# Patient Record
Sex: Female | Born: 1955 | Race: Black or African American | Hispanic: No | Smoking: Former smoker
Health system: Southern US, Community
[De-identification: ages and names within clinical notes are randomized; demographics above are authoritative.]

## PROBLEM LIST (undated history)

## (undated) ENCOUNTER — Emergency Department (HOSPITAL_COMMUNITY): Admission: EM | Source: Home / Self Care

## (undated) DIAGNOSIS — G43909 Migraine, unspecified, not intractable, without status migrainosus: Secondary | ICD-10-CM

## (undated) DIAGNOSIS — M1990 Unspecified osteoarthritis, unspecified site: Secondary | ICD-10-CM

## (undated) DIAGNOSIS — J069 Acute upper respiratory infection, unspecified: Secondary | ICD-10-CM

## (undated) DIAGNOSIS — E079 Disorder of thyroid, unspecified: Secondary | ICD-10-CM

## (undated) DIAGNOSIS — K529 Noninfective gastroenteritis and colitis, unspecified: Secondary | ICD-10-CM

## (undated) DIAGNOSIS — R112 Nausea with vomiting, unspecified: Secondary | ICD-10-CM

## (undated) DIAGNOSIS — E119 Type 2 diabetes mellitus without complications: Secondary | ICD-10-CM

## (undated) DIAGNOSIS — D649 Anemia, unspecified: Secondary | ICD-10-CM

## (undated) DIAGNOSIS — J189 Pneumonia, unspecified organism: Secondary | ICD-10-CM

## (undated) DIAGNOSIS — T8859XA Other complications of anesthesia, initial encounter: Secondary | ICD-10-CM

## (undated) DIAGNOSIS — Z9889 Other specified postprocedural states: Secondary | ICD-10-CM

## (undated) DIAGNOSIS — Z8719 Personal history of other diseases of the digestive system: Secondary | ICD-10-CM

## (undated) DIAGNOSIS — T4145XA Adverse effect of unspecified anesthetic, initial encounter: Secondary | ICD-10-CM

## (undated) HISTORY — DX: Acute upper respiratory infection, unspecified: J06.9

## (undated) HISTORY — PX: TUBAL LIGATION: SHX77

## (undated) HISTORY — PX: SINOSCOPY: SHX187

## (undated) HISTORY — PX: CATARACT EXTRACTION: SUR2

## (undated) HISTORY — PX: COLONOSCOPY: SHX174

## (undated) HISTORY — PX: NASAL POLYP SURGERY: SHX186

## (undated) HISTORY — DX: Type 2 diabetes mellitus without complications: E11.9

---

## 1999-04-01 ENCOUNTER — Emergency Department (HOSPITAL_COMMUNITY): Admission: EM | Admit: 1999-04-01 | Discharge: 1999-04-01 | Payer: Self-pay | Admitting: Emergency Medicine

## 1999-04-01 ENCOUNTER — Encounter: Payer: Self-pay | Admitting: Emergency Medicine

## 2004-11-28 ENCOUNTER — Other Ambulatory Visit: Admission: RE | Admit: 2004-11-28 | Discharge: 2004-11-28 | Payer: Self-pay | Admitting: Obstetrics & Gynecology

## 2004-12-11 ENCOUNTER — Encounter: Admission: RE | Admit: 2004-12-11 | Discharge: 2004-12-11 | Payer: Self-pay | Admitting: Internal Medicine

## 2005-03-16 ENCOUNTER — Emergency Department (HOSPITAL_COMMUNITY): Admission: EM | Admit: 2005-03-16 | Discharge: 2005-03-16 | Payer: Self-pay | Admitting: Emergency Medicine

## 2010-07-23 ENCOUNTER — Encounter (HOSPITAL_BASED_OUTPATIENT_CLINIC_OR_DEPARTMENT_OTHER): Payer: Self-pay

## 2010-07-24 ENCOUNTER — Encounter (HOSPITAL_BASED_OUTPATIENT_CLINIC_OR_DEPARTMENT_OTHER): Payer: Self-pay

## 2010-09-04 ENCOUNTER — Encounter (HOSPITAL_BASED_OUTPATIENT_CLINIC_OR_DEPARTMENT_OTHER): Payer: Self-pay

## 2011-11-28 ENCOUNTER — Emergency Department (HOSPITAL_COMMUNITY)
Admission: EM | Admit: 2011-11-28 | Discharge: 2011-11-28 | Discharge status: Against Medical Advice | Payer: BC Managed Care – PPO | Attending: Emergency Medicine | Admitting: Emergency Medicine

## 2011-11-28 ENCOUNTER — Encounter (HOSPITAL_COMMUNITY): Payer: Self-pay | Admitting: Emergency Medicine

## 2011-11-28 DIAGNOSIS — K089 Disorder of teeth and supporting structures, unspecified: Secondary | ICD-10-CM | POA: Insufficient documentation

## 2011-11-28 NOTE — ED Notes (Signed)
Pt alert, arrives from home, c/o jaw pain and swelling, onset several days ago, seen dentist last Monday, referral provided,  returns with cont pain

## 2011-11-29 ENCOUNTER — Emergency Department (HOSPITAL_COMMUNITY)
Admission: EM | Admit: 2011-11-29 | Discharge: 2011-11-29 | Disposition: A | Discharge status: Home / Self Care | Payer: BC Managed Care – PPO | Attending: Emergency Medicine | Admitting: Emergency Medicine

## 2011-11-29 ENCOUNTER — Encounter (HOSPITAL_COMMUNITY): Payer: Self-pay | Admitting: Emergency Medicine

## 2011-11-29 DIAGNOSIS — K0889 Other specified disorders of teeth and supporting structures: Secondary | ICD-10-CM

## 2011-11-29 DIAGNOSIS — Z87891 Personal history of nicotine dependence: Secondary | ICD-10-CM | POA: Insufficient documentation

## 2011-11-29 DIAGNOSIS — K089 Disorder of teeth and supporting structures, unspecified: Secondary | ICD-10-CM | POA: Insufficient documentation

## 2011-11-29 HISTORY — DX: Migraine, unspecified, not intractable, without status migrainosus: G43.909

## 2011-11-29 MED ORDER — ONDANSETRON HCL 4 MG PO TABS: 4.0000 mg | ORAL_TABLET | Freq: Four times a day (QID) | ORAL | Status: AC

## 2011-11-29 MED ORDER — OXYCODONE-ACETAMINOPHEN 5-325 MG PO TABS: ORAL_TABLET | ORAL | Status: AC

## 2011-11-29 NOTE — ED Notes (Signed)
Pt presents w/ dental pain upper and lower on left. On antibx from her own dentist and pain meds and is scheduled to see periodontist on Tuesday this coming week. Pt is unable to manage the pain.

## 2011-11-29 NOTE — ED Provider Notes (Signed)
History     CSN: [id number]  Arrival date & time 11/29/11  1107   First MD Initiated Contact with Patient 11/29/11 1257      Chief Complaint  Patient presents with  . Dental Pain    (Consider location/radiation/quality/duration/timing/severity/associated sxs/prior treatment) The history is provided by the patient.   56 y.o. female in no acute distress complaining of exacerbation of left upper and lower tooth pain. Patient was seen by her dentist and referred to a periodontist for further evaluation. Patient was given E-Mycin and a prescription for Percocet. Patient states when she takes the hydrocodone she feels nauseous. Patient denies any fever, nausea vomiting.    Past Medical History  Diagnosis Date  . Migraine     Past Surgical History  Procedure Date  . Tubal ligation     No family history on file.  History  Substance Use Topics  . Smoking status: Former Games developer  . Smokeless tobacco: Never Used  . Alcohol Use: No    OB History    Grav Para Term Preterm Abortions TAB SAB Ect Mult Living                  Review of Systems  Constitutional: Negative for fever.  Respiratory: Negative for shortness of breath.   Cardiovascular: Negative for chest pain.  Gastrointestinal: Negative for nausea, vomiting, abdominal pain and diarrhea.  All other systems reviewed and are negative.    Allergies  Review of patient's allergies indicates no known allergies.  Home Medications   Current Outpatient Rx  Name Route Sig Dispense Refill  . ALMOTRIPTAN MALATE 12.5 MG PO TABS Oral Take 12.5 mg by mouth as needed. may repeat in 2 hours if needed    . CLINDAMYCIN HCL 150 MG PO CAPS Oral Take 150 mg by mouth 3 (three) times daily.    Marland Kitchen HYDROCODONE-ACETAMINOPHEN 5-325 MG PO TABS Oral Take 1 tablet by mouth every 6 (six) hours as needed. Pain    . ADULT MULTIVITAMIN W/MINERALS CH Oral Take 1 tablet by mouth daily.      BP 130/67  Pulse 71  Temp 98.2 F (36.8 C) (Oral)   Resp 18  SpO2 98%  Physical Exam  Nursing note and vitals reviewed. Constitutional: She is oriented to person, place, and time. She appears well-developed and well-nourished. No distress.  HENT:  Head: Normocephalic.        No dental caries and broken teeth appreciated no swelling or induration to gums or cheek.  Eyes: Conjunctivae and EOM are normal.  Cardiovascular: Normal rate.   Pulmonary/Chest: Effort normal and breath sounds normal.  Abdominal: Soft. Bowel sounds are normal.  Musculoskeletal: Normal range of motion.  Neurological: She is alert and oriented to person, place, and time.  Psychiatric: She has a normal mood and affect.    ED Course  Procedures (including critical care time)  Labs Reviewed - No data to display No results found.   1. Pain, dental       MDM  I will change patient prescription from hydrocodone to oxycodone. I will also write her for Zofran to control the pain I have instructed her to continue to take clindamycin and follow with her dentist on Tuesday.        Wynetta Emery, PA-C 11/29/11 2027

## 2011-12-04 NOTE — ED Provider Notes (Signed)
Medical screening examination/treatment/procedure(s) were performed by non-physician practitioner and as supervising physician I was immediately available for consultation/collaboration.   Loren Racer, MD 12/04/11 0030

## 2013-02-02 ENCOUNTER — Ambulatory Visit
Admission: RE | Admit: 2013-02-02 | Discharge: 2013-02-02 | Disposition: A | Discharge status: Home / Self Care | Payer: BC Managed Care – PPO | Source: Ambulatory Visit | Attending: Internal Medicine | Admitting: Internal Medicine

## 2013-02-02 ENCOUNTER — Other Ambulatory Visit: Payer: Self-pay | Admitting: Internal Medicine

## 2013-02-02 DIAGNOSIS — M79671 Pain in right foot: Secondary | ICD-10-CM

## 2013-04-22 ENCOUNTER — Encounter (HOSPITAL_COMMUNITY): Payer: Self-pay | Admitting: Emergency Medicine

## 2013-04-22 ENCOUNTER — Emergency Department (HOSPITAL_COMMUNITY)
Admission: EM | Admit: 2013-04-22 | Discharge: 2013-04-23 | Disposition: A | Discharge status: Home / Self Care | Payer: BC Managed Care – PPO | Attending: Emergency Medicine | Admitting: Emergency Medicine

## 2013-04-22 ENCOUNTER — Emergency Department (HOSPITAL_COMMUNITY): Payer: BC Managed Care – PPO

## 2013-04-22 DIAGNOSIS — Z9889 Other specified postprocedural states: Secondary | ICD-10-CM | POA: Insufficient documentation

## 2013-04-22 DIAGNOSIS — R0789 Other chest pain: Secondary | ICD-10-CM | POA: Insufficient documentation

## 2013-04-22 DIAGNOSIS — Z87891 Personal history of nicotine dependence: Secondary | ICD-10-CM | POA: Insufficient documentation

## 2013-04-22 DIAGNOSIS — G43909 Migraine, unspecified, not intractable, without status migrainosus: Secondary | ICD-10-CM | POA: Insufficient documentation

## 2013-04-22 DIAGNOSIS — Z79899 Other long term (current) drug therapy: Secondary | ICD-10-CM | POA: Insufficient documentation

## 2013-04-22 LAB — BASIC METABOLIC PANEL
BUN: 13 mg/dL (ref 6–23)
CHLORIDE: 100 meq/L (ref 96–112)
CO2: 27 mEq/L (ref 19–32)
Calcium: 9.5 mg/dL (ref 8.4–10.5)
Creatinine, Ser: 0.77 mg/dL (ref 0.50–1.10)
GFR calc Af Amer: >90 mL/min (ref >90)
GFR calc non Af Amer: >90 mL/min (ref >90)
GLUCOSE: 121 mg/dL — AB (ref 70–99)
POTASSIUM: 4.3 meq/L (ref 3.7–5.3)
Sodium: 142 mEq/L (ref 137–147)

## 2013-04-22 LAB — CBC
HEMATOCRIT: 40.4 % (ref 36.0–46.0)
HEMOGLOBIN: 14.1 g/dL (ref 12.0–15.0)
MCH: 27.4 pg (ref 26.0–34.0)
MCHC: 34.9 g/dL (ref 30.0–36.0)
MCV: 78.6 fL (ref 78.0–100.0)
Platelets: 277 10*3/uL (ref 150–400)
RBC: 5.14 MIL/uL — ABNORMAL HIGH (ref 3.87–5.11)
RDW: 13.7 % (ref 11.5–15.5)
WBC: 7.7 10*3/uL (ref 4.0–10.5)

## 2013-04-22 LAB — POCT I-STAT TROPONIN I: Troponin i, poc: 0 ng/mL (ref 0.00–0.08)

## 2013-04-22 NOTE — ED Provider Notes (Signed)
CSN: [id number]     Arrival date & time 04/22/13  2152 History   First MD Initiated Contact with Patient 04/22/13 2301     Chief Complaint  Patient presents with  . Chest Pain   (Consider location/radiation/quality/duration/timing/severity/associated sxs/prior Treatment) Patient is a 58 y.o. female presenting with chest pain. The history is provided by the patient.  Chest Pain She had nasal surgery 3 days ago and started having right-sided chest pain yesterday. Pain is over the anterior aspect of her chest and also in the right scapular area. It is sharp and worse with a deep breath. Pain is moderate to severe and she rates it at 8/10. She denies fever, chills, sweats. She denies cough. She denies dyspnea. She's also developed a headache which is mild to moderate in intensity. She is taking tramadol with no relief. She was advised to avoid ibuprofen following her nasal surgery because the risk of bleeding. She did call her PCP who advised her to come to the ED to make sure she has not developed a blood clot because of the recent surgery.  Past Medical History  Diagnosis Date  . Migraine    Past Surgical History  Procedure Laterality Date  . Tubal ligation    . Nasal polyp surgery     History reviewed. No pertinent family history. History  Substance Use Topics  . Smoking status: Former Games developer  . Smokeless tobacco: Never Used  . Alcohol Use: No   OB History   Grav Para Term Preterm Abortions TAB SAB Ect Mult Living                 Review of Systems  Cardiovascular: Positive for chest pain.  All other systems reviewed and are negative.    Allergies  Tetracyclines & related  Home Medications   Current Outpatient Rx  Name  Route  Sig  Dispense  Refill  . almotriptan (AXERT) 12.5 MG tablet   Oral   Take 12.5 mg by mouth as needed. may repeat in 2 hours if needed         . cefUROXime (CEFTIN) 250 MG tablet   Oral   Take 250 mg by mouth 2 (two) times daily with a meal.         . fluconazole (DIFLUCAN) 150 MG tablet   Oral   Take 150 mg by mouth daily. One tablet now then one tablet on the 5th and 10th day         . Multiple Vitamin (MULTIVITAMIN WITH MINERALS) TABS   Oral   Take 1 tablet by mouth daily.         . traMADol (ULTRAM) 50 MG tablet   Oral   Take 50 mg by mouth every 4 (four) hours as needed for moderate pain.          BP 139/79  Pulse 87  Temp(Src) 98.6 F (37 C) (Oral)  Resp 14  Ht 5\' 2"  (1.575 m)  Wt 173 lb 11.2 oz (78.79 kg)  BMI 31.76 kg/m2  SpO2 99% Physical Exam  Nursing note and vitals reviewed.  58 year old female, resting comfortably and in no acute distress. Vital signs are significant for hypertension with blood pressure 150/89. Oxygen saturation is 98%, which is normal. Head is normocephalic and atraumatic. PERRLA, EOMI. Oropharynx is clear. Neck is nontender and supple without adenopathy or JVD. Back is nontender and there is no CVA tenderness. Lungs are clear without rales, wheezes, or rhonchi. Chest is moderately tender  in the right side anteriorly and posteriorly and this does reproduce her pain. Heart has regular rate and rhythm without murmur. Abdomen is soft, flat, nontender without masses or hepatosplenomegaly and peristalsis is normoactive. Extremities have no cyanosis or edema, full range of motion is present. Skin is warm and dry without rash. Neurologic: Mental status is normal, cranial nerves are intact, there are no motor or sensory deficits.  ED Course  Procedures (including critical care time) Labs Review Results for orders placed during the hospital encounter of 04/22/13  CBC      Result Value Range   WBC 7.7  4.0 - 10.5 K/uL   RBC 5.14 (*) 3.87 - 5.11 MIL/uL   Hemoglobin 14.1  12.0 - 15.0 g/dL   HCT 16.140.4  09.636.0 - 04.546.0 %   MCV 78.6  78.0 - 100.0 fL   MCH 27.4  26.0 - 34.0 pg   MCHC 34.9  30.0 - 36.0 g/dL   RDW 40.913.7  81.111.5 - 91.415.5 %   Platelets 277  150 - 400 K/uL  BASIC METABOLIC  PANEL      Result Value Range   Sodium 142  137 - 147 mEq/L   Potassium 4.3  3.7 - 5.3 mEq/L   Chloride 100  96 - 112 mEq/L   CO2 27  19 - 32 mEq/L   Glucose, Bld 121 (*) 70 - 99 mg/dL   BUN 13  6 - 23 mg/dL   Creatinine, Ser 7.820.77  0.50 - 1.10 mg/dL   Calcium 9.5  8.4 - 95.610.5 mg/dL   GFR calc non Af Amer >90  >90 mL/min   GFR calc Af Amer >90  >90 mL/min  D-DIMER, QUANTITATIVE      Result Value Range   D-Dimer, Quant 0.30  0.00 - 0.48 ug/mL-FEU  POCT I-STAT TROPONIN I      Result Value Range   Troponin i, poc 0.00  0.00 - 0.08 ng/mL   Comment 3            Imaging Review Dg Chest 2 View  04/22/2013   CLINICAL DATA:  Chest pain and back pain  EXAM: CHEST  2 VIEW  COMPARISON:  05/19/2010  FINDINGS: The heart size and mediastinal contours are within normal limits. Scarring noted within the left base. No airspace consolidation. The visualized skeletal structures are unremarkable.  IMPRESSION: 1. Left base scarring.   Electronically Signed   By: Signa Kellaylor  Stroud M.D.   On: 04/22/2013 23:23   Images viewed by me.  EKG Interpretation    Date/Time:  Saturday April 22 2013 21:55:59 EST Ventricular Rate:  85 PR Interval:  144 QRS Duration: 82 QT Interval:  352 QTC Calculation: 418 R Axis:   31 Text Interpretation:  Normal sinus rhythm Biatrial enlargement Cannot rule out Anterior infarct , age undetermined Abnormal ECG No old tracing to compare Confirmed by [facility]  MD, Annette Bertelson ([id number]) on 04/22/2013 11:04:19 PM            MDM   1. Musculoskeletal chest pain    Chest pain which seems to be chest wall pain. Her risk of pulmonary embolism would be very low following the type of surgery that she had. However, d-dimer will be obtained. Initial workup is negative including unremarkable ECG, and negative troponin, normal CBC and metabolic panel and unremarkable chest x-ray. She will be given a dose of oxycodone-acetaminophen for pain. She had nausea when she tried taking hydrocodone so she  will also be given a  dose of ondansetron.  D-dimer has come back normal. She got good relief of pain with oxycodone-acetaminophen. She is discharged with prescriptions for oxycodone-acetaminophen and ondansetron.  Dione Booze, MD 04/23/13 [phone]

## 2013-04-22 NOTE — ED Notes (Addendum)
Pt transported to xray 

## 2013-04-22 NOTE — ED Notes (Signed)
Presents with chest pain began in back from shoulder blade to shoulder blade, worse with inspiration radiation to front of chest pain began last night. Recent nasal surgery this week to remove polyps. Denies SOB, reports mild nausea, dnies dizziness. Nothing makes pain better. Pain is constant.

## 2013-04-23 LAB — D-DIMER, QUANTITATIVE (NOT AT ARMC): D DIMER QUANT: 0.3 ug{FEU}/mL (ref 0.00–0.48)

## 2013-04-23 MED ORDER — ONDANSETRON HCL 4 MG PO TABS: 4.0000 mg | ORAL_TABLET | Freq: Four times a day (QID) | ORAL | Status: DC | PRN

## 2013-04-23 MED ORDER — OXYCODONE-ACETAMINOPHEN 5-325 MG PO TABS: 1.0000 | ORAL_TABLET | ORAL | Status: DC | PRN

## 2013-04-23 MED ORDER — ONDANSETRON 4 MG PO TBDP
8.0000 mg | ORAL_TABLET | Freq: Once | ORAL | Status: AC
  Administered 2013-04-23: 8 mg via ORAL
  Filled 2013-04-23: qty 2

## 2013-04-23 MED ORDER — OXYCODONE-ACETAMINOPHEN 5-325 MG PO TABS
1.0000 | ORAL_TABLET | Freq: Once | ORAL | Status: AC
  Administered 2013-04-23: 1 via ORAL
  Filled 2013-04-23: qty 1

## 2013-04-23 NOTE — Discharge Instructions (Signed)
Chest Wall Pain Chest wall pain is pain in or around the bones and muscles of your chest. It may take up to 6 weeks to get better. It may take longer if you must stay physically active in your work and activities.  CAUSES  Chest wall pain may happen on its own. However, it may be caused by:  A viral illness like the flu.  Injury.  Coughing.  Exercise.  Arthritis.  Fibromyalgia.  Shingles. HOME CARE INSTRUCTIONS   Avoid overtiring physical activity. Try not to strain or perform activities that cause pain. This includes any activities using your chest or your abdominal and side muscles, especially if heavy weights are used.  Put ice on the sore area.  Put ice in a plastic bag.  Place a towel between your skin and the bag.  Leave the ice on for 15-20 minutes per hour while awake for the first 2 days.  Only take over-the-counter or prescription medicines for pain, discomfort, or fever as directed by your caregiver. SEEK IMMEDIATE MEDICAL CARE IF:   Your pain increases, or you are very uncomfortable.  You have a fever.  Your chest pain becomes worse.  You have new, unexplained symptoms.  You have nausea or vomiting.  You feel sweaty or lightheaded.  You have a cough with phlegm (sputum), or you cough up blood. MAKE SURE YOU:   Understand these instructions.  Will watch your condition.  Will get help right away if you are not doing well or get worse. Document Released: 03/16/2005 Document Revised: 06/08/2011 Document Reviewed: 11/10/2010 [facility] Patient Information 2014 [place], [place].  Acetaminophen; Oxycodone tablets What is this medicine? ACETAMINOPHEN; OXYCODONE (a set a MEE noe fen; ox i KOE done) is a pain reliever. It is used to treat mild to moderate pain. This medicine may be used for other purposes; ask your health care provider or pharmacist if you have questions. COMMON BRAND NAME(S): Endocet, Magnacet, Narvox, Percocet, Perloxx, Primalev, Primlev,  Roxicet, Xolox What should I tell my health care provider before I take this medicine? They need to know if you have any of these conditions: -brain tumor -Crohn's disease, inflammatory bowel disease, or ulcerative colitis -drug abuse or addiction -head injury -heart or circulation problems -if you often drink alcohol -kidney disease or problems going to the bathroom -liver disease -lung disease, asthma, or breathing problems -an unusual or allergic reaction to acetaminophen, oxycodone, other opioid analgesics, other medicines, foods, dyes, or preservatives -pregnant or trying to get pregnant -breast-feeding How should I use this medicine? Take this medicine by mouth with a full glass of water. Follow the directions on the prescription label. Take your medicine at regular intervals. Do not take your medicine more often than directed. Talk to your pediatrician regarding the use of this medicine in children. Special care may be needed. Patients over 54 years old may have a stronger reaction and need a smaller dose. Overdosage: If you think you have taken too much of this medicine contact a poison control center or emergency room at once. NOTE: This medicine is only for you. Do not share this medicine with others. What if I miss a dose? If you miss a dose, take it as soon as you can. If it is almost time for your next dose, take only that dose. Do not take double or extra doses. What may interact with this medicine? -alcohol -antihistamines -barbiturates like amobarbital, butalbital, butabarbital, methohexital, pentobarbital, phenobarbital, thiopental, and secobarbital -benztropine -drugs for bladder problems like solifenacin,  trospium, oxybutynin, tolterodine, hyoscyamine, and methscopolamine -drugs for breathing problems like ipratropium and tiotropium -drugs for certain stomach or intestine problems like propantheline, homatropine methylbromide, glycopyrrolate, atropine, belladonna, and  dicyclomine -general anesthetics like etomidate, ketamine, nitrous oxide, propofol, desflurane, enflurane, halothane, isoflurane, and sevoflurane -medicines for depression, anxiety, or psychotic disturbances -medicines for sleep -muscle relaxants -naltrexone -narcotic medicines (opiates) for pain -phenothiazines like perphenazine, thioridazine, chlorpromazine, mesoridazine, fluphenazine, prochlorperazine, promazine, and trifluoperazine -scopolamine -tramadol -trihexyphenidyl This list may not describe all possible interactions. Give your health care provider a list of all the medicines, herbs, non-prescription drugs, or dietary supplements you use. Also tell them if you smoke, drink alcohol, or use illegal drugs. Some items may interact with your medicine. What should I watch for while using this medicine? Tell your doctor or health care professional if your pain does not go away, if it gets worse, or if you have new or a different type of pain. You may develop tolerance to the medicine. Tolerance means that you will need a higher dose of the medication for pain relief. Tolerance is normal and is expected if you take this medicine for a long time. Do not suddenly stop taking your medicine because you may develop a severe reaction. Your body becomes used to the medicine. This does NOT mean you are addicted. Addiction is a behavior related to getting and using a drug for a non-medical reason. If you have pain, you have a medical reason to take pain medicine. Your doctor will tell you how much medicine to take. If your doctor wants you to stop the medicine, the dose will be slowly lowered over time to avoid any side effects. You may get drowsy or dizzy. Do not drive, use machinery, or do anything that needs mental alertness until you know how this medicine affects you. Do not stand or sit up quickly, especially if you are an older patient. This reduces the risk of dizzy or fainting spells. Alcohol may  interfere with the effect of this medicine. Avoid alcoholic drinks. There are different types of narcotic medicines (opiates) for pain. If you take more than one type at the same time, you may have more side effects. Give your health care provider a list of all medicines you use. Your doctor will tell you how much medicine to take. Do not take more medicine than directed. Call emergency for help if you have problems breathing. The medicine will cause constipation. Try to have a bowel movement at least every 2 to 3 days. If you do not have a bowel movement for 3 days, call your doctor or health care professional. Do not take Tylenol (acetaminophen) or medicines that have acetaminophen with this medicine. Too much acetaminophen can be very dangerous. Many nonprescription medicines contain acetaminophen. Always read the labels carefully to avoid taking more acetaminophen. What side effects may I notice from receiving this medicine? Side effects that you should report to your doctor or health care professional as soon as possible: -allergic reactions like skin rash, itching or hives, swelling of the face, lips, or tongue -breathing difficulties, wheezing -confusion -light headedness or fainting spells -severe stomach pain -unusually weak or tired -yellowing of the skin or the whites of the eyes  Side effects that usually do not require medical attention (report to your doctor or health care professional if they continue or are bothersome): -dizziness -drowsiness -nausea -vomiting This list may not describe all possible side effects. Call your doctor for medical advice about side effects. You may  report side effects to FDA at 1-800-FDA-1088. Where should I keep my medicine? Keep out of the reach of children. This medicine can be abused. Keep your medicine in a safe place to protect it from theft. Do not share this medicine with anyone. Selling or giving away this medicine is dangerous and against the  law. Store at room temperature between 20 and 25 degrees C (68 and 77 degrees F). Keep container tightly closed. Protect from light. This medicine may cause accidental overdose and death if it is taken by other adults, children, or pets. Flush any unused medicine down the toilet to reduce the chance of harm. Do not use the medicine after the expiration date. NOTE: This sheet is a summary. It may not cover all possible information. If you have questions about this medicine, talk to your doctor, pharmacist, or health care provider.  2014, Elsevier/Gold Standard. (2012-11-07 13:17:35)  Ondansetron tablets What is this medicine? ONDANSETRON (on DAN se tron) is used to treat nausea and vomiting caused by chemotherapy. It is also used to prevent or treat nausea and vomiting after surgery. This medicine may be used for other purposes; ask your health care provider or pharmacist if you have questions. COMMON BRAND NAME(S): Zofran What should I tell my health care provider before I take this medicine? They need to know if you have any of these conditions: -heart disease -history of irregular heartbeat -liver disease -low levels of magnesium or potassium in the blood -an unusual or allergic reaction to ondansetron, granisetron, other medicines, foods, dyes, or preservatives -pregnant or trying to get pregnant -breast-feeding How should I use this medicine? Take this medicine by mouth with a glass of water. Follow the directions on your prescription label. Take your doses at regular intervals. Do not take your medicine more often than directed. Talk to your pediatrician regarding the use of this medicine in children. Special care may be needed. Overdosage: If you think you have taken too much of this medicine contact a poison control center or emergency room at once. NOTE: This medicine is only for you. Do not share this medicine with others. What if I miss a dose? If you miss a dose, take it as soon  as you can. If it is almost time for your next dose, take only that dose. Do not take double or extra doses. What may interact with this medicine? Do not take this medicine with any of the following medications: -apomorphine -certain medicines for fungal infections like fluconazole, itraconazole, ketoconazole, posaconazole, voriconazole -cisapride -dofetilide -dronedarone -pimozide -thioridazine -ziprasidone  This medicine may also interact with the following medications: -carbamazepine -certain medicines for depression, anxiety, or psychotic disturbances -fentanyl -linezolid -MAOIs like Carbex, Eldepryl, Marplan, Nardil, and Parnate -methylene blue (injected into a vein) -other medicines that prolong the QT interval (cause an abnormal heart rhythm) -phenytoin -rifampicin -tramadol This list may not describe all possible interactions. Give your health care provider a list of all the medicines, herbs, non-prescription drugs, or dietary supplements you use. Also tell them if you smoke, drink alcohol, or use illegal drugs. Some items may interact with your medicine. What should I watch for while using this medicine? Check with your doctor or health care professional right away if you have any sign of an allergic reaction. What side effects may I notice from receiving this medicine? Side effects that you should report to your doctor or health care professional as soon as possible: -allergic reactions like skin rash, itching or hives, swelling of  the face, lips or tongue -breathing problems -confusion -dizziness -fast or irregular heartbeat -feeling faint or lightheaded, falls -fever and chills -loss of balance or coordination -seizures -sweating -swelling of the hands or feet -tightness in the chest -tremors -unusually weak or tired Side effects that usually do not require medical attention (report to your doctor or health care professional if they continue or are  bothersome): -constipation or diarrhea -headache This list may not describe all possible side effects. Call your doctor for medical advice about side effects. You may report side effects to FDA at 1-800-FDA-1088. Where should I keep my medicine? Keep out of the reach of children. Store between 2 and 30 degrees C (36 and 86 degrees F). Throw away any unused medicine after the expiration date. NOTE: This sheet is a summary. It may not cover all possible information. If you have questions about this medicine, talk to your doctor, pharmacist, or health care provider.  2014, Elsevier/Gold Standard. (2012-12-21 16:27:45)

## 2013-11-28 ENCOUNTER — Other Ambulatory Visit: Payer: Self-pay | Admitting: Internal Medicine

## 2013-11-28 DIAGNOSIS — M549 Dorsalgia, unspecified: Secondary | ICD-10-CM

## 2013-12-02 ENCOUNTER — Ambulatory Visit
Admission: RE | Admit: 2013-12-02 | Discharge: 2013-12-02 | Disposition: A | Discharge status: Home / Self Care | Payer: BC Managed Care – PPO | Source: Ambulatory Visit | Attending: Internal Medicine | Admitting: Internal Medicine

## 2013-12-02 DIAGNOSIS — M549 Dorsalgia, unspecified: Secondary | ICD-10-CM

## 2014-01-09 ENCOUNTER — Ambulatory Visit: Admission: RE | Admit: 2014-01-09 | Payer: BC Managed Care – PPO | Source: Ambulatory Visit

## 2014-04-11 ENCOUNTER — Encounter: Payer: Self-pay | Admitting: Physical Therapy

## 2014-04-11 ENCOUNTER — Ambulatory Visit: Payer: BLUE CROSS/BLUE SHIELD | Attending: Internal Medicine | Admitting: Physical Therapy

## 2014-04-11 DIAGNOSIS — M545 Low back pain, unspecified: Secondary | ICD-10-CM

## 2014-04-11 NOTE — Patient Instructions (Addendum)
Lower Trunk Rotation   Bring both knees in to chest. Rotate from side to side, keeping knees together and feet off floor. Repeat ___5-10_ times per set. Do _1___ sets per session. Do _2___ sessions per day.  http://orth.exer.us/151   Copyright  VHI. All rights reserved.     Back Hyperextension: Using Arms   Lying face down with head resting on hands.  Rest here. 30-60 sec  Try to press up onto elbows and rest here.  30 sec.   STOP IF LEG PAIN GETS WORSE!!  Repeat _2-3___ times per set. Do _1-2___ sets per session. Do __2__ sessions per day.  Copyright  VHI. All rights reserved.

## 2014-04-11 NOTE — Therapy (Signed)
[facility] [address] [place], [place], [address] Phone: [phone]   Fax:  [phone]  Physical Therapy Evaluation  Patient Details  Name: Jacqueline Fischer MRN: [id number] Date of Birth: 09-28-1955 Referring Provider:  Lillia Mountain, MD  Encounter Date: 04/11/2014      PT End of Session - 04/11/14 0931    Visit Number 1   Number of Visits 12   Date for PT Re-Evaluation 05/23/14   PT Start Time 0847   PT Stop Time 0945   PT Time Calculation (min) 58 min      Past Medical History  Diagnosis Date  . Migraine     Past Surgical History  Procedure Laterality Date  . Tubal ligation    . Nasal polyp surgery      There were no vitals taken for this visit.  Visit Diagnosis:  Low back pain potentially associated with radiculopathy - Plan: PT plan of care cert/re-cert      Subjective Assessment - 04/11/14 0852    Symptoms Patient reports having surgery last Jan (Sinus), and reported onset of low back pain.  Condition improved and now  pain is exacerbated since Dec.  Pain in low back R and L, radiates to R or L.  , min sensory sx in leg (just below knee), denies weakness,    Pertinent History Works in [employer], lifting, pushing and pulling 80% , full time   Limitations Lifting;Standing;Walking;House hold activities  Sleeping, does water aerobics, dancing   How long can you sit comfortably? no difficulty, but has diff sitting after 30 min    How long can you stand comfortably? 4 hours on a mat (not cement)   How long can you walk comfortably? can do 1 hour without going fast   Diagnostic tests MRI results: 01/11/14: L4-L5 with loss of height, trace disc bulge, facet OA, small R protrusion and mild foraminal stenosis, impinging nerve root., L5-S1 mild facet OA, disc dessication and trace bulding.    Patient Stated Goals to maintain back for work and continued fitness.  Just signed up for a weighttrainng class and is  concerned about injuring herself.    Currently in Pain? Yes   Pain Score 5   morning are worse   Pain Location Back   Pain Orientation Lower;Left   Pain Descriptors / Indicators Throbbing;Aching   Pain Type Chronic pain   Pain Radiating Towards post thigh    Pain Onset More than a month ago   Pain Frequency Intermittent   Aggravating Factors  lifting   Pain Relieving Factors avoids meds but does occ take Advil/Tylenol, muscle cream, supine, pillow   Effect of Pain on Daily Activities difficult to work efficiently   Multiple Pain Sites No          [facility] PT Assessment - 04/11/14 0902    Assessment   Medical Diagnosis low back pain   Onset Date --  Jan 2015   Next MD Visit --  unsure   Prior Therapy --  No   Precautions   Precautions None   Restrictions   Weight Bearing Restrictions No   Balance Screen   Has the patient fallen in the past 6 months No   Has the patient had a decrease in activity level because of a fear of falling?  No   Is the patient reluctant to leave their home because of a fear of falling?  No   Home Environment   Living Enviornment  Private residence   Living Arrangements Alone;Children   Type of Home House   Home Access Stairs to enter   Home Layout Two level   Prior Function   Level of Independence Independent with basic ADLs   Cognition   Overall Cognitive Status Within Functional Limits for tasks assessed   Observation/Other Assessments   Focus on Therapeutic Outcomes (FOTO)  53%   Sensation   Light Touch Appears Intact   AROM   Lumbar Flexion WNL   Lumbar Extension 40%  pain   Lumbar - Right Side Bend WNL   Lumbar - Left Side Bend WNL  pain on L   Lumbar - Right Rotation WNL   Lumbar - Left Rotation WNL   Strength   Right Hip Flexion 4/5   Right Hip Extension 4+/5   Right Hip ABduction 3+/5   Left Hip Flexion 4/5   Left Hip Extension 3+/5   Flexibility   Soft Tissue Assessment /Muscle Lenght yes   Quadriceps --  and psoas tight    Palpation   Palpation unable to elicit pain or soreness with palpation   Special Tests    Special Tests --  Neg SLR bilat.          [facility] Adult PT Treatment/Exercise - 04/11/14 1048    Lumbar Exercises: Stretches   Lower Trunk Rotation 5 reps   Prone on Elbows Stretch 2 reps;30 seconds   Modalities   Modalities Moist Heat   Moist Heat Therapy   Number Minutes Moist Heat 15 Minutes   Moist Heat Location Other (comment)  low back seated          PT Education - 04/11/14 0915    Education provided Yes   Education Details PT/POC, HEP   Person(s) Educated Patient   Methods Explanation;Demonstration   Comprehension Verbalized understanding;Returned demonstration          PT Short Term Goals - 04/11/14 1533    PT SHORT TERM GOAL #1   Title Pt. will be I with initial HEP for LE/back   Time 3   Period Weeks   Status New   PT SHORT TERM GOAL #2   Title Pt. will report less LE pain/sx, 25% improved overall.    Time 3   Period Weeks   Status New   PT SHORT TERM GOAL #3   Title Pt. will be able to wake with pain <4/10.    Time 3   Period Weeks   Status New   PT SHORT TERM GOAL #4   Title Pt. will be able to do home tasks without exacerbating pain    Time 3   Period Weeks   Status New   PT SHORT TERM GOAL #5   Title Pt will demo safe lifting techniques   Time 3   Period Weeks   Status New           PT Long Term Goals - 04/11/14 1535    PT LONG TERM GOAL #1   Title Pt. will be I with advanced HEP    Time 6   Period Weeks   Status New   PT LONG TERM GOAL #2   Title Pt will stand as needed at work and report min pain <2/10 most days of the week   Time 6   Period Weeks   Status New   PT LONG TERM GOAL #3   Title Pt. will lift, push, pull items at work and report no increase in  pain from baseline.    Time 6   Period Weeks   Status New   PT LONG TERM GOAL #4   Title Pt will report LE sx as occasional and minimal   Time 6   Period Weeks   Status New                Plan - 04/11/14 1049    Clinical Impression Statement Patient with symptoms consistent with MRI findings, exhibits weakness in core and hips, radiating pain/sx which seems to be relieved with gentle prone/ extension of spine.  She will benefit from skilled PT to review posture and body mechanics and core conditioning  to prevent further progression of spinal disc disease.    Pt will benefit from skilled therapeutic intervention in order to improve on the following deficits Decreased range of motion;Difficulty walking;Impaired flexibility;Improper body mechanics;Other (comment);Decreased strength;Decreased mobility;Pain;Increased fascial restricitons  abnormal sensation   Rehab Potential Good   PT Frequency 2x / week   PT Duration 6 weeks   PT Treatment/Interventions ADLs/Self Care Home Management;Moist Heat;Therapeutic activities;Patient/family education;Passive range of motion;Therapeutic exercise;Traction;Ultrasound;Manual techniques;Dry needling;Neuromuscular re-education;Electrical Stimulation;Functional mobility training   PT Next Visit Plan review trunk/hip stretches (lower rotation and prone). Stretch hip flexors and begin core stab. review lifting.  (airport luggage)   PT Home Exercise Plan see above   Consulted and Agree with Plan of Care Patient         Problem List There are no active problems to display for this patient.   Hermon Zea 04/11/2014, 3:40 PM  [facility] [address] [place], [place], [address] Phone: [phone]   Fax:  [phone]

## 2014-04-17 ENCOUNTER — Ambulatory Visit: Payer: BLUE CROSS/BLUE SHIELD

## 2014-04-17 DIAGNOSIS — M545 Low back pain, unspecified: Secondary | ICD-10-CM

## 2014-04-17 NOTE — Therapy (Signed)
[facility] [address] [place], [place], [address] Phone: [phone]   Fax:  [phone]  Physical Therapy Treatment  Patient Details  Name: Jacqueline Fischer MRN: [id number] Date of Birth: [birth year] Referring Provider:  Lillia Mountain, MD  Encounter Date: 04/17/2014      PT End of Session - 04/17/14 1457    Visit Number 2   Number of Visits 12   Date for PT Re-Evaluation 05/23/14   PT Start Time 1415   PT Stop Time 1510   PT Time Calculation (min) 55 min   Activity Tolerance Patient tolerated treatment well   Behavior During Therapy [facility] for tasks assessed/performed      Past Medical History  Diagnosis Date  . Migraine     Past Surgical History  Procedure Laterality Date  . Tubal ligation    . Nasal polyp surgery      There were no vitals taken for this visit.  Visit Diagnosis:  Low back pain potentially associated with radiculopathy      Subjective Assessment - 04/17/14 1410    Symptoms Today she reports a achey pain with lifting and with prolonged sitting..    Currently in Pain? Yes   Pain Score 5    Pain Location Back   Pain Orientation Lower;Left   Pain Descriptors / Indicators Aching   Pain Type Chronic pain   Pain Onset More than a month ago   Pain Frequency Intermittent   Aggravating Factors  lifting   Multiple Pain Sites No                    [facility] Adult PT Treatment/Exercise - 04/17/14 1459    Posture/Postural Control   Posture Comments Body mechanincs instructions with hi hinge adn review of each item and PT demo   Lumbar Exercises: Stretches   Lower Trunk Rotation 5 reps  reviewed and modified for comfort   Prone on Elbows Stretch 1 rep;60 seconds   Press Ups 5 reps   5 seconds each   Moist Heat Therapy   Number Minutes Moist Heat 15 Minutes   Moist Heat Location --  back   Electrical Stimulation   Electrical Stimulation Location lower back   Electrical  Stimulation Action IF C   Electrical Stimulation Parameters level to tolerance   Electrical Stimulation Goals Pain   Manual Therapy   Manual Therapy Joint mobilization   Joint Mobilization Grade 2-3 PA glides lumbar and lower thoracic 3 sets 25-30 reps                PT Education - 04/17/14 1457    Education provided Yes   Education Details Designer, fashion/clothing) Educated Patient   Methods Explanation;Demonstration;Tactile cues;Verbal cues;Handout   Comprehension Verbalized understanding;Returned demonstration          PT Short Term Goals - 04/11/14 1533    PT SHORT TERM GOAL #1   Title Pt. will be I with initial HEP for LE/back   Time 3   Period Weeks   Status New   PT SHORT TERM GOAL #2   Title Pt. will report less LE pain/sx, 25% improved overall.    Time 3   Period Weeks   Status New   PT SHORT TERM GOAL #3   Title Pt. will be able to wake with pain <4/10.    Time 3   Period Weeks   Status New   PT SHORT TERM GOAL #4  Title Pt. will be able to do home tasks without exacerbating pain    Time 3   Period Weeks   Status New   PT SHORT TERM GOAL #5   Title Pt will demo safe lifting techniques   Time 3   Period Weeks   Status New           PT Long Term Goals - 04/11/14 1535    PT LONG TERM GOAL #1   Title Pt. will be I with advanced HEP    Time 6   Period Weeks   Status New   PT LONG TERM GOAL #2   Title Pt will stand as needed at work and report min pain <2/10 most days of the week   Time 6   Period Weeks   Status New   PT LONG TERM GOAL #3   Title Pt. will lift, push, pull items at work and report no increase in pain from baseline.    Time 6   Period Weeks   Status New   PT LONG TERM GOAL #4   Title Pt will report LE sx as occasional and minimal   Time 6   Period Weeks   Status New               Plan - 04/17/14 1458    Clinical Impression Statement No changes. She did well with mechanics education.    Pt will benefit  from skilled therapeutic intervention in order to improve on the following deficits Decreased range of motion;Difficulty walking;Impaired flexibility;Improper body mechanics;Other (comment);Decreased strength;Decreased mobility;Pain;Increased fascial restricitons   Rehab Potential Good   PT Frequency 2x / week   PT Duration 6 weeks   PT Treatment/Interventions ADLs/Self Care Home Management;Moist Heat;Therapeutic activities;Patient/family education;Passive range of motion;Therapeutic exercise;Traction;Ultrasound;Manual techniques;Dry needling;Neuromuscular re-education;Electrical Stimulation;Functional mobility training   PT Next Visit Plan review trunk/hip stretches (lower rotation and prone). Stretch hip flexors and begin core stab. review lifting.  (airport luggage)   PT Home Exercise Plan Hip hinge exercise   Consulted and Agree with Plan of Care Patient        Problem List There are no active problems to display for this patient.   Caprice RedChasse, Blessing Ozga M PT 04/17/2014, 3:03 PM  [facility] [address] [place], [place], [address] Phone: [phone]   Fax:  [phone]

## 2014-04-17 NOTE — Patient Instructions (Addendum)
Hip Extension: Hamstring Single Leg Deadlift (Eccentric)   Holding weights, stand on affected leg with knee slightly flexed. Lift other leg while slowly bending forward at the hip. Use ___ lb weight. ___ reps per set, ___ sets per day, ___ days per week. Add ___ lbs when you achieve ___ repetitions. Touch floor with weight.  Copyright  VHI. All rights reserved.  Squat: Wide Leg   Feet wide apart, toes out, squat, holding weight between knees. Weight should be at ankles.  Bend at the hips with hips going back behind hips. Keep back straight.  Put eight on stool if you cannot get to floor.  Repeat _3-15___ times per set. Do ___1-2_ sets per session. Do __2-5__ sessions per week. Use __0-10__ lb weight.  Copyright  VHI. All rights reserved.  Squat: Half   Arms hanging at sides, squat by dropping hips back as if sitting on a chair. Keep knees over ankles, hips behind heels.  Keep back straight.  Bend at hips and knees will bend with hips.   Repeat ___3-15_ times per set. Do __1-2__ sets per session. Do __2-5__ sessions per week. Use __0-10HIP / KNEE: Flexion / Extension, Squat Unilateral Hip / Glute Extension: Standing - Straight Leg (Machine) Hip Extension (Standing) Healthy Back - Yoga Tree Balance Reviewed HEP and used cues to correct technique and modified leg position to make it more comfortable rotation.                                                                                                                               She is to do only the third exercise on today s sheet. We may add the other exercises later

## 2014-04-24 ENCOUNTER — Ambulatory Visit: Payer: BLUE CROSS/BLUE SHIELD | Admitting: Physical Therapy

## 2014-04-24 DIAGNOSIS — M545 Low back pain, unspecified: Secondary | ICD-10-CM

## 2014-04-24 NOTE — Patient Instructions (Signed)
Bracing With Arm Raise (Quadruped)  On hands and knees find neutral spine. Tighten pelvic floor and abdominals and hold. Alternately lift arm to shoulder level. Repeat _10__ times. Do _1__ times a day.  Quadruped Alternate Hip Extension   Shift weight to one side and raise opposite leg. Keep trunk steady. __10_ reps per set, ___ sets per day, _7__ days per week Repeat with other leg.  Bracing With Arm / Leg Raise (Quadruped)  On hands and knees find neutral spine. Tighten pelvic floor and abdominals and hold. Alternating, lift arm to shoulder level and opposite leg to hip level. Repeat _10__ times. Do _1__ times a day.

## 2014-04-24 NOTE — Therapy (Signed)
[facility] [address] [place], [place], [address] Phone: [phone]   Fax:  [phone]  Physical Therapy Treatment  Patient Details  Name: Jacqueline Fischer MRN: [id number] Date of Birth: 09-Mar-1956 Referring Provider:  Lillia Mountain, MD  Encounter Date: 04/24/2014      PT End of Session - 04/24/14 1521    Visit Number 3   Number of Visits 12   Date for PT Re-Evaluation 05/23/14   PT Start Time 1327   PT Stop Time 1425   PT Time Calculation (min) 58 min   Activity Tolerance Patient tolerated treatment well      Past Medical History  Diagnosis Date  . Migraine     Past Surgical History  Procedure Laterality Date  . Tubal ligation    . Nasal polyp surgery      There were no vitals taken for this visit.  Visit Diagnosis:  Low back pain potentially associated with radiculopathy      Subjective Assessment - 04/24/14 1325    Symptoms Feeling much better.  Went to [employer] and that helped a lot.  Yesterday had left LE pain while working/lifting.  No LE pain today.  HEP going OK, less achiness.   Currently in Pain? Yes   Pain Score 4    Pain Location Back   Pain Orientation Right   Aggravating Factors  bending over   Pain Relieving Factors taking Alleve, heat, [place]                    [facility] Adult PT Treatment/Exercise - 04/24/14 1518    Lumbar Exercises: Supine   Ab Set 10 reps;5 seconds   Isometric Hip Flexion 10 reps;5 seconds   Other Supine Lumbar Exercises --  Supine ab brace with bent knee/hip flexion 10x    Lumbar Exercises: Prone   Other Prone Lumbar Exercises --  Prone press ups 10x, with exhale 5x   Lumbar Exercises: Quadruped   Single Arm Raise 5 reps   Straight Leg Raise 5 reps   Opposite Arm/Leg Raise 5 reps   Modalities   Modalities Moist Heat;Electrical Stimulation   Moist Heat Therapy   Number Minutes Moist Heat 15 Minutes   Moist Heat Location --  lumbar    Electrical Stimulation   Electrical Stimulation Location lower back   Electrical Stimulation Action IFC   Electrical Stimulation Parameters 10 ma   Electrical Stimulation Goals Pain   Manual Therapy   Manual Therapy Joint mobilization   Joint Mobilization Grade 3 P-A L1-L5 20 sec each level  Prone press ups with manual overpressure 10x                PT Education - 04/24/14 1520    Education provided Yes   Education Details abdominal brace; bird dogs; discussed weight training class   Person(s) Educated Patient   Methods Explanation;Demonstration;Handout   Comprehension Verbalized understanding;Returned demonstration          PT Short Term Goals - 04/24/14 1529    PT SHORT TERM GOAL #1   Title Pt. will be I with initial HEP for LE/back   Time 3   Period Weeks   Status Achieved   PT SHORT TERM GOAL #2   Title Pt. will report less LE pain/sx, 25% improved overall.    Time 3   Period Weeks   Status On-going   PT SHORT TERM GOAL #3   Title Pt. will be able  to wake with pain <4/10.    Time 3   Period Weeks   Status On-going   PT SHORT TERM GOAL #4   Title Pt. will be able to do home tasks without exacerbating pain    Time 3   Period Weeks   Status On-going   PT SHORT TERM GOAL #5   Title Pt will demo safe lifting techniques   Status Achieved           PT Long Term Goals - 04/24/14 1529    PT LONG TERM GOAL #1   Title Pt. will be I with advanced HEP    Time 6   Period Weeks   Status On-going   PT LONG TERM GOAL #2   Title Pt will stand as needed at work and report min pain <2/10 most days of the week   Time 6   Period Weeks   Status On-going   PT LONG TERM GOAL #3   Title Pt. will lift, push, pull items at work and report no increase in pain from baseline.    Time 6   Period Weeks   Status On-going   PT LONG TERM GOAL #4   Title Pt will report LE sx as occasional and minimal   Time 6   Period Weeks   Status On-going                Plan - 04/24/14 1522    Clinical Impression Statement No LE pain today.  Extension directional preference.  Patient reports a mild increase in pain following core strengthening exercises but states "it's not enough that I would take anything for it."  Progressing as anticipated toward goals.  Therapist monitoring pain throughout treatment session and modifying exercises as needed.     PT Next Visit Plan Continue with core strengthening; review HEP including prone press ups, lumbar rotation, supine ab brace and bird dogs; IFC/heat as needed for pain control        Problem List There are no active problems to display for this patient.   Vivien PrestoSimpson, Keval Nam C 04/24/2014, 3:31 PM  [facility] [address] [place], [place], [address] Phone: [phone]   Fax:  [phone]   Lavinia SharpsStacy Tayshon Winker, PT 04/24/2014 3:31 PM Phone: [phone] Fax: [phone]

## 2014-04-25 ENCOUNTER — Ambulatory Visit: Payer: BLUE CROSS/BLUE SHIELD | Admitting: Physical Therapy

## 2014-05-01 ENCOUNTER — Ambulatory Visit: Payer: BLUE CROSS/BLUE SHIELD | Attending: Internal Medicine | Admitting: Physical Therapy

## 2014-05-01 DIAGNOSIS — M545 Low back pain, unspecified: Secondary | ICD-10-CM

## 2014-05-01 NOTE — Therapy (Signed)
[facility] [address] [place], [place], [address] Phone: [phone]   Fax:  [phone]  Physical Therapy Treatment  Patient Details  Name: Jacqueline Fischer MRN: [id number] Date of Birth: 07-22-1955 Referring Provider:  Lillia Mountain, MD  Encounter Date: 05/01/2014      PT End of Session - 05/01/14 1501    Visit Number 4   Number of Visits 12   Date for PT Re-Evaluation 05/23/14   PT Start Time 1419   PT Stop Time 1523   PT Time Calculation (min) 64 min   Activity Tolerance Patient tolerated treatment well;Patient limited by pain      Past Medical History  Diagnosis Date  . Migraine     Past Surgical History  Procedure Laterality Date  . Tubal ligation    . Nasal polyp surgery      There were no vitals taken for this visit.  Visit Diagnosis:  Low back pain potentially associated with radiculopathy      Subjective Assessment - 05/01/14 1418    Symptoms Lt side 3/10  Moved from Lt to rt water eased warm    Currently in Pain? Yes   Pain Score 3    Pain Location Back   Pain Orientation Right   Pain Descriptors / Indicators Aching   Pain Relieving Factors warm water   Multiple Pain Sites No                    [facility] Adult PT Treatment/Exercise - 05/01/14 1427    Lumbar Exercises: Stretches   Active Hamstring Stretch 3 reps;30 seconds  each   Lower Trunk Rotation 10 seconds   Lumbar Exercises: Supine   Bent Knee Raise 10 reps  cued breathing, technique   Bridge 10 reps;1 second  added to home exercise   Other Supine Lumbar Exercises 10    Lumbar Exercises: Prone   Other Prone Lumbar Exercises --  10 total, 5 with exhale   Lumbar Exercises: Quadruped   Single Arm Raise 5 reps   Straight Leg Raise 5 reps   Opposite Arm/Leg Raise 10 reps   Moist Heat Therapy   Number Minutes Moist Heat 15 Minutes   Moist Heat Location --  back   Electrical Stimulation   Electrical Stimulation  Location lower back   Electrical Stimulation Action IFC   Electrical Stimulation Parameters 9   Electrical Stimulation Goals Pain                PT Education - 05/01/14 1500    Education provided Yes   Education Details bridge   Person(s) Educated Patient   Methods Explanation;Demonstration;Handout   Comprehension Verbalized understanding;Returned demonstration          PT Short Term Goals - 05/01/14 1503    PT SHORT TERM GOAL #1   Title Pt. will be I with initial HEP for LE/back   Time 3   Period Weeks   PT SHORT TERM GOAL #2   Title Pt. will report less LE pain/sx, 25% improved overall.    Time 3   Period Weeks   PT SHORT TERM GOAL #3   Title Pt. will be able to wake with pain <4/10.    Time 3   Period Weeks   Status On-going   PT SHORT TERM GOAL #4   Title Pt. will be able to do home tasks without exacerbating pain    Time 3   Period Weeks  Status On-going           PT Long Term Goals - 04/24/14 1529    PT LONG TERM GOAL #1   Title Pt. will be I with advanced HEP    Time 6   Period Weeks   Status On-going   PT LONG TERM GOAL #2   Title Pt will stand as needed at work and report min pain <2/10 most days of the week   Time 6   Period Weeks   Status On-going   PT LONG TERM GOAL #3   Title Pt. will lift, push, pull items at work and report no increase in pain from baseline.    Time 6   Period Weeks   Status On-going   PT LONG TERM GOAL #4   Title Pt will report LE sx as occasional and minimal   Time 6   Period Weeks   Status On-going               Plan - 05/01/14 1502    Clinical Impression Statement able to progress home exercise and continued current exercises minor cues   PT Next Visit Plan review bridges, work woward more flexibility and hip strength        Problem List There are no active problems to display for this patient.  Liz BeachKaren Emett Stapel, PTA 05/01/2014 6:15 PM Phone: [phone] Fax: [phone]    Liz BeachHARRIS,Jakia Kennebrew 05/01/2014, 6:15 PM  [facility] [address] [place], [place], [address] Phone: [phone]   Fax:  [phone]

## 2014-05-02 ENCOUNTER — Ambulatory Visit: Payer: BLUE CROSS/BLUE SHIELD | Admitting: Physical Therapy

## 2014-05-15 ENCOUNTER — Ambulatory Visit: Payer: BLUE CROSS/BLUE SHIELD | Admitting: Physical Therapy

## 2014-05-15 DIAGNOSIS — M545 Low back pain, unspecified: Secondary | ICD-10-CM

## 2014-05-15 NOTE — Therapy (Addendum)
[place], [place], [address] Phone: [phone]   Fax:  [phone]  Physical Therapy Treatment/ReEvaluation/Discharge Summary  Patient Details  Name: Jacqueline Fischer MRN: [id number] Date of Birth: 1955/12/15 Referring Provider:  Irven Shelling, MD  Encounter Date: 05/15/2014      PT End of Session - 05/15/14 1523    Visit Number 5   Number of Visits 12   Date for PT Re-Evaluation 06/12/14   PT Start Time 1330   PT Stop Time 1430   PT Time Calculation (min) 60 min      Past Medical History  Diagnosis Date  . Migraine     Past Surgical History  Procedure Laterality Date  . Tubal ligation    . Nasal polyp surgery      There were no vitals taken for this visit.  Visit Diagnosis:  Low back pain potentially associated with radiculopathy - Plan: PT plan of care cert/re-cert      Subjective Assessment - 05/15/14 1334    Symptoms Much better.  Between a 3 or 4 if bending over.  More intermittent rather than everyday.  Rotating would aggravate during water exercise.  Has returned to weight training with minimal difficulty.   Will go down the leg with sidelying and relieved with change of position.   Pertinent History Works in [employer], lifting, pushing and pulling 80% , full time   How long can you sit comfortably? 30 min   How long can you walk comfortably? long distance time/distance   Currently in Pain? Yes   Pain Score 3    Pain Location Back   Pain Orientation Left   Pain Type Chronic pain   Aggravating Factors  bending over; standing too long   Pain Relieving Factors Advil; light exercise in the water; heat/e-stim                    [facility] Adult PT Treatment/Exercise - 05/15/14 0001    Exercises   Exercises Lumbar   Lumbar Exercises: Stretches   Active Hamstring Stretch 3 reps;30 seconds   Piriformis Stretch 3 reps;20 seconds   Lumbar Exercises: Standing   Row 20  reps;Theraband   Theraband Level (Row) Level 3 (Green)   Shoulder Extension 20 reps;Theraband   Theraband Level (Shoulder Extension) Level 3 (Green)   Lumbar Exercises: Supine   Ab Set 10 reps;5 seconds   Isometric Hip Flexion 10 reps;5 seconds   Other Supine Lumbar Exercises 10   hip/knee lift to 90 degrees   Lumbar Exercises: Sidelying   Clam 15 reps  R/L green band   Moist Heat Therapy   Number Minutes Moist Heat 15 Minutes   Moist Heat Location --  lumbar   Electrical Stimulation   Electrical Stimulation Location lower back   Electrical Stimulation Action IFC   Electrical Stimulation Parameters 9 ma   Electrical Stimulation Goals Pain                  PT Short Term Goals - 05/15/14 1528    PT SHORT TERM GOAL #3   Title Pt. will be able to wake with pain <4/10.    Status Achieved   PT SHORT TERM GOAL #4   Title Pt. will be able to do home tasks without exacerbating pain    Status Achieved   PT SHORT TERM GOAL #5   Title Pt will demo safe lifting techniques   Status Achieved  PT Long Term Goals - 05/15/14 1528    PT LONG TERM GOAL #1   Title Pt. will be I with advanced HEP    Time 6   Period Weeks   Status On-going   PT LONG TERM GOAL #2   Title Pt will stand as needed at work and report min pain <2/10 most days of the week   Time 6   Period Weeks   Status On-going   PT LONG TERM GOAL #3   Title Pt. will lift, push, pull items at work and report no increase in pain from baseline.    Time 6   Period Weeks   Status On-going   PT LONG TERM GOAL #4   Title Pt will report LE sx as occasional and minimal   Time 6   Period Weeks   Status On-going               Plan - 05/15/14 1524    Clinical Impression Statement Patient remains highly motivated with rehab.  Recertification completed to decrease frequency to 1x/week or once every 2 weeks over the next 4 weeks.  The patient is progressing well with partial goals met.  Should meet  remaining goals in 2-3 visits.  Therapist monitoring pain and providing verbal/tactile cues for abdominal bracing.     PT Next Visit Plan Continue with intermediate level core stabilization;  hip flexibility; heat/estim as needed for pain control.         PHYSICAL THERAPY DISCHARGE SUMMARY  Visits from Start of Care: 5  Current functional level related to goals / functional outcomes: The patient wished to continue with PT at a decreased frequency.  She then cancelled her next few appointments for various reasons.  Her chart has been inactive for several months.  Discharge from PT.   Remaining deficits: See above   Education / Equipment: HEP  Plan: Patient agrees to discharge.  Patient goals were not met. Patient is being discharged due to not returning since the last visit.  ?????        Problem List There are no active problems to display for this patient.   Alvera Singh 05/15/2014, 3:31 PM  [facility] [address] [place], [place], [address] Phone: [phone]   Fax:  [phone]   Ruben Im, PT 05/15/2014 3:32 PM Phone: [phone] Fax: [phone]

## 2014-05-30 ENCOUNTER — Ambulatory Visit: Payer: BLUE CROSS/BLUE SHIELD | Admitting: Rehabilitation

## 2014-06-06 ENCOUNTER — Encounter: Payer: BLUE CROSS/BLUE SHIELD | Admitting: Rehabilitation

## 2014-09-30 ENCOUNTER — Emergency Department (HOSPITAL_COMMUNITY): Payer: BLUE CROSS/BLUE SHIELD

## 2014-09-30 ENCOUNTER — Emergency Department (HOSPITAL_COMMUNITY)
Admission: EM | Admit: 2014-09-30 | Discharge: 2014-09-30 | Disposition: A | Discharge status: Home / Self Care | Payer: BLUE CROSS/BLUE SHIELD | Attending: Emergency Medicine | Admitting: Emergency Medicine

## 2014-09-30 ENCOUNTER — Encounter (HOSPITAL_COMMUNITY): Payer: Self-pay | Admitting: Nurse Practitioner

## 2014-09-30 DIAGNOSIS — R079 Chest pain, unspecified: Secondary | ICD-10-CM | POA: Insufficient documentation

## 2014-09-30 DIAGNOSIS — Z9851 Tubal ligation status: Secondary | ICD-10-CM | POA: Diagnosis not present

## 2014-09-30 DIAGNOSIS — G43909 Migraine, unspecified, not intractable, without status migrainosus: Secondary | ICD-10-CM | POA: Insufficient documentation

## 2014-09-30 DIAGNOSIS — R109 Unspecified abdominal pain: Secondary | ICD-10-CM | POA: Diagnosis not present

## 2014-09-30 DIAGNOSIS — Z87891 Personal history of nicotine dependence: Secondary | ICD-10-CM | POA: Insufficient documentation

## 2014-09-30 DIAGNOSIS — R110 Nausea: Secondary | ICD-10-CM | POA: Insufficient documentation

## 2014-09-30 DIAGNOSIS — Z79899 Other long term (current) drug therapy: Secondary | ICD-10-CM | POA: Insufficient documentation

## 2014-09-30 LAB — URINALYSIS, ROUTINE W REFLEX MICROSCOPIC
BILIRUBIN URINE: NEGATIVE
GLUCOSE, UA: NEGATIVE mg/dL
HGB URINE DIPSTICK: NEGATIVE
Ketones, ur: NEGATIVE mg/dL
Leukocytes, UA: NEGATIVE
NITRITE: NEGATIVE
PROTEIN: NEGATIVE mg/dL
Specific Gravity, Urine: 1.016 (ref 1.005–1.030)
UROBILINOGEN UA: 0.2 mg/dL (ref 0.0–1.0)
pH: 7.5 (ref 5.0–8.0)

## 2014-09-30 LAB — BASIC METABOLIC PANEL
ANION GAP: 10 (ref 5–15)
BUN: 12 mg/dL (ref 6–20)
CO2: 25 mmol/L (ref 22–32)
Calcium: 9 mg/dL (ref 8.9–10.3)
Chloride: 102 mmol/L (ref 101–111)
Creatinine, Ser: 0.95 mg/dL (ref 0.44–1.00)
GFR calc Af Amer: >60 mL/min (ref >60)
Glucose, Bld: 161 mg/dL — ABNORMAL HIGH (ref 65–99)
Potassium: 4.1 mmol/L (ref 3.5–5.1)
Sodium: 137 mmol/L (ref 135–145)

## 2014-09-30 LAB — CBC
HCT: 40.8 % (ref 36.0–46.0)
Hemoglobin: 13.7 g/dL (ref 12.0–15.0)
MCH: 27 pg (ref 26.0–34.0)
MCHC: 33.6 g/dL (ref 30.0–36.0)
MCV: 80.5 fL (ref 78.0–100.0)
Platelets: 290 10*3/uL (ref 150–400)
RBC: 5.07 MIL/uL (ref 3.87–5.11)
RDW: 13.8 % (ref 11.5–15.5)
WBC: 5.8 10*3/uL (ref 4.0–10.5)

## 2014-09-30 LAB — I-STAT TROPONIN, ED: Troponin i, poc: 0 ng/mL (ref 0.00–0.08)

## 2014-09-30 MED ORDER — SODIUM CHLORIDE 0.9 % IV BOLUS (SEPSIS)
500.0000 mL | Freq: Once | INTRAVENOUS | Status: AC
  Administered 2014-09-30: 500 mL via INTRAVENOUS

## 2014-09-30 MED ORDER — KETOROLAC TROMETHAMINE 30 MG/ML IJ SOLN
15.0000 mg | Freq: Once | INTRAMUSCULAR | Status: AC
Start: 2014-09-30 — End: 2014-09-30
  Administered 2014-09-30: 15 mg via INTRAVENOUS
  Filled 2014-09-30: qty 1

## 2014-09-30 MED ORDER — ONDANSETRON HCL 4 MG/2ML IJ SOLN
4.0000 mg | Freq: Once | INTRAMUSCULAR | Status: AC
Start: 2014-09-30 — End: 2014-09-30
  Administered 2014-09-30: 4 mg via INTRAVENOUS
  Filled 2014-09-30: qty 2

## 2014-09-30 MED ORDER — ONDANSETRON HCL 4 MG PO TABS: 4.0000 mg | ORAL_TABLET | Freq: Four times a day (QID) | ORAL | Status: DC | PRN

## 2014-09-30 MED ORDER — FENTANYL CITRATE (PF) 100 MCG/2ML IJ SOLN
100.0000 ug | Freq: Once | INTRAMUSCULAR | Status: AC
  Administered 2014-09-30: 100 ug via INTRAVENOUS
  Filled 2014-09-30: qty 2

## 2014-09-30 MED ORDER — FENTANYL CITRATE (PF) 100 MCG/2ML IJ SOLN
50.0000 ug | Freq: Once | INTRAMUSCULAR | Status: AC
  Administered 2014-09-30: 50 ug via INTRAVENOUS
  Filled 2014-09-30: qty 2

## 2014-09-30 MED ORDER — ONDANSETRON HCL 4 MG/2ML IJ SOLN
4.0000 mg | Freq: Once | INTRAMUSCULAR | Status: AC
  Administered 2014-09-30: 4 mg via INTRAVENOUS
  Filled 2014-09-30: qty 2

## 2014-09-30 NOTE — Discharge Instructions (Signed)
Flank Pain °Flank pain refers to pain that is located on the side of the body between the upper abdomen and the back. The pain may occur over a short period of time (acute) or may be long-term or reoccurring (chronic). It may be mild or severe. Flank pain can be caused by many things. °CAUSES  °Some of the more common causes of flank pain include: °· Muscle strains.   °· Muscle spasms.   °· A disease of your spine (vertebral disk disease).   °· A lung infection (pneumonia).   °· Fluid around your lungs (pulmonary edema).   °· A kidney infection.   °· Kidney stones.   °· A very painful skin rash caused by the chickenpox virus (shingles).   °· Gallbladder disease.   °HOME CARE INSTRUCTIONS  °Home care will depend on the cause of your pain. In general, °· Rest as directed by your caregiver. °· Drink enough fluids to keep your urine clear or pale yellow. °· Only take over-the-counter or prescription medicines as directed by your caregiver. Some medicines may help relieve the pain. °· Tell your caregiver about any changes in your pain. °· Follow up with your caregiver as directed. °SEEK IMMEDIATE MEDICAL CARE IF:  °· Your pain is not controlled with medicine.   °· You have new or worsening symptoms. °· Your pain increases.   °· You have abdominal pain.   °· You have shortness of breath.   °· You have persistent nausea or vomiting.   °· You have swelling in your abdomen.   °· You feel faint or pass out.   °· You have blood in your urine. °· You have a fever or persistent symptoms for more than 2-3 days. °· You have a fever and your symptoms suddenly get worse. °MAKE SURE YOU:  °· Understand these instructions. °· Will watch your condition. °· Will get help right away if you are not doing well or get worse. °Document Released: 05/07/2005 Document Revised: 12/09/2011 Document Reviewed: 10/29/2011 °ExitCare® Patient Information ©2015 [employer]. This information is not intended to replace advice given to you by your  health care provider. Make sure you discuss any questions you have with your health care provider. ° °

## 2014-09-30 NOTE — ED Notes (Signed)
She reports she woke an hour ago with abd pain, flank pain, back pain, chest pain, n/v. Symptoms have persisted since onset. She took pepto bismol with no relief. Nothing increases the pain. A&Ox4, resp e/u

## 2014-10-08 ENCOUNTER — Emergency Department (HOSPITAL_COMMUNITY): Payer: BLUE CROSS/BLUE SHIELD

## 2014-10-08 ENCOUNTER — Encounter (HOSPITAL_COMMUNITY): Payer: Self-pay

## 2014-10-08 ENCOUNTER — Emergency Department (HOSPITAL_COMMUNITY)
Admission: EM | Admit: 2014-10-08 | Discharge: 2014-10-08 | Disposition: A | Discharge status: Home / Self Care | Payer: BLUE CROSS/BLUE SHIELD | Attending: Emergency Medicine | Admitting: Emergency Medicine

## 2014-10-08 DIAGNOSIS — K8020 Calculus of gallbladder without cholecystitis without obstruction: Secondary | ICD-10-CM

## 2014-10-08 DIAGNOSIS — Z87891 Personal history of nicotine dependence: Secondary | ICD-10-CM | POA: Diagnosis not present

## 2014-10-08 DIAGNOSIS — K8070 Calculus of gallbladder and bile duct without cholecystitis without obstruction: Secondary | ICD-10-CM | POA: Insufficient documentation

## 2014-10-08 DIAGNOSIS — Z79899 Other long term (current) drug therapy: Secondary | ICD-10-CM | POA: Diagnosis not present

## 2014-10-08 DIAGNOSIS — K8050 Calculus of bile duct without cholangitis or cholecystitis without obstruction: Secondary | ICD-10-CM

## 2014-10-08 DIAGNOSIS — G43909 Migraine, unspecified, not intractable, without status migrainosus: Secondary | ICD-10-CM | POA: Insufficient documentation

## 2014-10-08 DIAGNOSIS — R079 Chest pain, unspecified: Secondary | ICD-10-CM | POA: Diagnosis not present

## 2014-10-08 DIAGNOSIS — R1010 Upper abdominal pain, unspecified: Secondary | ICD-10-CM | POA: Diagnosis present

## 2014-10-08 LAB — CBC
HCT: 40 % (ref 36.0–46.0)
Hemoglobin: 13.1 g/dL (ref 12.0–15.0)
MCH: 26.6 pg (ref 26.0–34.0)
MCHC: 32.8 g/dL (ref 30.0–36.0)
MCV: 81.1 fL (ref 78.0–100.0)
Platelets: 305 10*3/uL (ref 150–400)
RBC: 4.93 MIL/uL (ref 3.87–5.11)
RDW: 13.4 % (ref 11.5–15.5)
WBC: 4.4 10*3/uL (ref 4.0–10.5)

## 2014-10-08 LAB — URINALYSIS, ROUTINE W REFLEX MICROSCOPIC
Bilirubin Urine: NEGATIVE
Glucose, UA: NEGATIVE mg/dL
Hgb urine dipstick: NEGATIVE
Ketones, ur: NEGATIVE mg/dL
LEUKOCYTES UA: NEGATIVE
Nitrite: NEGATIVE
Protein, ur: NEGATIVE mg/dL
SPECIFIC GRAVITY, URINE: 1.017 (ref 1.005–1.030)
Urobilinogen, UA: 0.2 mg/dL (ref 0.0–1.0)
pH: 7 (ref 5.0–8.0)

## 2014-10-08 LAB — BASIC METABOLIC PANEL
Anion gap: 7 (ref 5–15)
BUN: 13 mg/dL (ref 6–20)
CO2: 25 mmol/L (ref 22–32)
Calcium: 8.7 mg/dL — ABNORMAL LOW (ref 8.9–10.3)
Chloride: 102 mmol/L (ref 101–111)
Creatinine, Ser: 0.72 mg/dL (ref 0.44–1.00)
GFR calc Af Amer: >60 mL/min (ref >60)
GFR calc non Af Amer: >60 mL/min (ref >60)
GLUCOSE: 166 mg/dL — AB (ref 65–99)
POTASSIUM: 4.1 mmol/L (ref 3.5–5.1)
Sodium: 134 mmol/L — ABNORMAL LOW (ref 135–145)

## 2014-10-08 LAB — HEPATIC FUNCTION PANEL
ALK PHOS: 105 U/L (ref 38–126)
ALT: 15 U/L (ref 14–54)
AST: 20 U/L (ref 15–41)
Albumin: 3.6 g/dL (ref 3.5–5.0)
Total Bilirubin: 0.2 mg/dL — ABNORMAL LOW (ref 0.3–1.2)
Total Protein: 7.3 g/dL (ref 6.5–8.1)

## 2014-10-08 LAB — I-STAT TROPONIN, ED: Troponin i, poc: 0 ng/mL (ref 0.00–0.08)

## 2014-10-08 LAB — LIPASE, BLOOD: Lipase: 17 U/L — ABNORMAL LOW (ref 22–51)

## 2014-10-08 MED ORDER — FAMOTIDINE 20 MG PO TABS
20.0000 mg | ORAL_TABLET | Freq: Once | ORAL | Status: AC
  Administered 2014-10-08: 20 mg via ORAL
  Filled 2014-10-08: qty 1

## 2014-10-08 MED ORDER — GI COCKTAIL ~~LOC~~
30.0000 mL | Freq: Once | ORAL | Status: AC
  Administered 2014-10-08: 30 mL via ORAL
  Filled 2014-10-08: qty 30

## 2014-10-08 MED ORDER — MORPHINE SULFATE 4 MG/ML IJ SOLN
4.0000 mg | Freq: Once | INTRAMUSCULAR | Status: DC
  Filled 2014-10-08: qty 1

## 2014-10-08 MED ORDER — TRAMADOL HCL 50 MG PO TABS: 50.0000 mg | ORAL_TABLET | Freq: Four times a day (QID) | ORAL | Status: DC | PRN

## 2014-10-08 MED ORDER — ONDANSETRON 8 MG PO TBDP: 8.0000 mg | ORAL_TABLET | Freq: Three times a day (TID) | ORAL | Status: DC | PRN

## 2014-10-08 MED ORDER — IBUPROFEN 200 MG PO TABS
400.0000 mg | ORAL_TABLET | Freq: Once | ORAL | Status: AC
  Administered 2014-10-08: 400 mg via ORAL
  Filled 2014-10-08: qty 2

## 2014-10-08 MED ORDER — ONDANSETRON HCL 4 MG/2ML IJ SOLN
4.0000 mg | Freq: Once | INTRAMUSCULAR | Status: DC
  Filled 2014-10-08: qty 2

## 2014-10-08 NOTE — ED Notes (Signed)
Pt presents with c/o chest tightness, bilateral flank pain, and abdominal pain. [facility] Pt reports that these symptoms woke her up out of her sleep this morning. Pt also reports some nausea as well at this time.

## 2014-10-08 NOTE — ED Notes (Signed)
Patient refused Morphine,Zofran, and IV start. EDP notified. Patient requested po pain meds.

## 2014-10-08 NOTE — ED Notes (Signed)
UNABLE TO OBTAIN BLOOD FROM LEFT AC

## 2014-10-08 NOTE — ED Notes (Signed)
Patient inquiring whether or not Ibuprofen dose was a prescription pain med/dose. Explained to patient.

## 2014-10-08 NOTE — ED Notes (Signed)
US Tech in the room with patient.

## 2014-10-08 NOTE — ED Provider Notes (Signed)
CSN: [id number]     Arrival date & time 10/08/14  4098 History   First MD Initiated Contact with Patient 10/08/14 0802     Chief Complaint  Patient presents with  . Chest Pain  . Flank Pain  . Abdominal Pain     (Consider location/radiation/quality/duration/timing/severity/associated sxs/prior Treatment) Patient is a 59 y.o. female presenting with chest pain, flank pain, and abdominal pain. The history is provided by the patient.  Chest Pain Associated symptoms: abdominal pain   Associated symptoms: no back pain, no cough, no fever, no headache, no shortness of breath and not vomiting   Flank Pain Associated symptoms include chest pain and abdominal pain. Pertinent negatives include no headaches and no shortness of breath.  Abdominal Pain Associated symptoms: chest pain   Associated symptoms: no chills, no constipation, no cough, no diarrhea, no dysuria, no fever, no hematuria, no shortness of breath, no sore throat, no vaginal bleeding, no vaginal discharge and no vomiting   pt c/o abd pain from upper abd to lower abdomen for the past day. Pain is dull, cramping, moderate, waxes and wanes in intensity but constant. No specific exacerbating or alleviating factors. No relation to eating. Radiates to mid back. No dysuria or hematuria. ?remote hx stomach ulcer. Denies hx pancreatitis or gallstones. Nausea. No vomiting. Having normal bms, including today. No abd distension or prior surgery. Denies chest pain or sob. No cough or uri c/o. No fever or chills. States had same symptoms 1 week ago, states ED workup negative, and symptoms had resolved.      Past Medical History  Diagnosis Date  . Migraine    Past Surgical History  Procedure Laterality Date  . Tubal ligation    . Nasal polyp surgery     No family history on file. History  Substance Use Topics  . Smoking status: Former Games developer  . Smokeless tobacco: Never Used  . Alcohol Use: No   OB History    No data available      Review of Systems  Constitutional: Negative for fever and chills.  HENT: Negative for sore throat.   Eyes: Negative for redness.  Respiratory: Negative for cough and shortness of breath.   Cardiovascular: Positive for chest pain. Negative for leg swelling.  Gastrointestinal: Positive for abdominal pain. Negative for vomiting, diarrhea, constipation and abdominal distention.  Endocrine: Negative for polyuria.  Genitourinary: Positive for flank pain. Negative for dysuria, hematuria, vaginal bleeding and vaginal discharge.  Musculoskeletal: Negative for back pain and neck pain.  Skin: Negative for rash.  Neurological: Negative for headaches.  Hematological: Does not bruise/bleed easily.  Psychiatric/Behavioral: Negative for confusion.      Allergies  Hydrocodone; Oxycodone; and Tetracyclines & related  Home Medications   Prior to Admission medications   Medication Sig Start Date End Date Taking? Authorizing Provider  almotriptan (AXERT) 12.5 MG tablet Take 12.5 mg by mouth as needed. may repeat in 2 hours if needed    Historical Provider, MD  Cyanocobalamin (VITAMIN B-12 CR PO) Take by mouth.    Historical Provider, MD  Flaxseed, Linseed, (FLAX SEEDS PO) Take by mouth.    Historical Provider, MD  Multiple Vitamin (MULTIVITAMIN WITH MINERALS) TABS Take 1 tablet by mouth daily.    Historical Provider, MD  ondansetron (ZOFRAN) 4 MG tablet Take 1 tablet (4 mg total) by mouth every 6 (six) hours as needed for nausea or vomiting. 09/30/14   Nelva Nay, MD  oxyCODONE-acetaminophen (PERCOCET) 5-325 MG per tablet Take 1 tablet by  mouth every 4 (four) hours as needed. Patient not taking: Reported on 04/11/2014 04/23/13   Dione Booze, MD   BP 160/95 mmHg  Pulse 94  Temp(Src) 97.9 F (36.6 C) (Oral)  Resp 18  SpO2 100% Physical Exam  Constitutional: She appears well-developed and well-nourished. No distress.  HENT:  Mouth/Throat: Oropharynx is clear and moist.  Eyes: Conjunctivae are  normal. No scleral icterus.  Neck: Neck supple. No tracheal deviation present.  Cardiovascular: Normal rate, regular rhythm, normal heart sounds and intact distal pulses.  Exam reveals no gallop and no friction rub.   No murmur heard. Pulmonary/Chest: Effort normal and breath sounds normal. No respiratory distress.  Abdominal: Soft. Normal appearance and bowel sounds are normal. She exhibits no distension and no mass. There is no tenderness. There is no rebound and no guarding.  Genitourinary:  No cva tenderness  Musculoskeletal: She exhibits no edema or tenderness.  Neurological: She is alert.  Skin: Skin is warm and dry. No rash noted. She is not diaphoretic.  Psychiatric: She has a normal mood and affect.  Nursing note and vitals reviewed.   ED Course  Procedures (including critical care time) Labs Review  Results for orders placed or performed during the hospital encounter of 10/08/14  CBC  Result Value Ref Range   WBC 4.4 4.0 - 10.5 K/uL   RBC 4.93 3.87 - 5.11 MIL/uL   Hemoglobin 13.1 12.0 - 15.0 g/dL   HCT 16.1 09.6 - 04.5 %   MCV 81.1 78.0 - 100.0 fL   MCH 26.6 26.0 - 34.0 pg   MCHC 32.8 30.0 - 36.0 g/dL   RDW 40.9 81.1 - 91.4 %   Platelets 305 150 - 400 K/uL  Basic metabolic panel  Result Value Ref Range   Sodium 134 (L) 135 - 145 mmol/L   Potassium 4.1 3.5 - 5.1 mmol/L   Chloride 102 101 - 111 mmol/L   CO2 25 22 - 32 mmol/L   Glucose, Bld 166 (H) 65 - 99 mg/dL   BUN 13 6 - 20 mg/dL   Creatinine, Ser 7.82 0.44 - 1.00 mg/dL   Calcium 8.7 (L) 8.9 - 10.3 mg/dL   GFR calc non Af Amer >60 >60 mL/min   GFR calc Af Amer >60 >60 mL/min   Anion gap 7 5 - 15  Urinalysis, Routine w reflex microscopic (not at [facility])  Result Value Ref Range   Color, Urine YELLOW YELLOW   APPearance CLEAR CLEAR   Specific Gravity, Urine 1.017 1.005 - 1.030   pH 7.0 5.0 - 8.0   Glucose, UA NEGATIVE NEGATIVE mg/dL   Hgb urine dipstick NEGATIVE NEGATIVE   Bilirubin Urine NEGATIVE NEGATIVE    Ketones, ur NEGATIVE NEGATIVE mg/dL   Protein, ur NEGATIVE NEGATIVE mg/dL   Urobilinogen, UA 0.2 0.0 - 1.0 mg/dL   Nitrite NEGATIVE NEGATIVE   Leukocytes, UA NEGATIVE NEGATIVE  Hepatic function panel  Result Value Ref Range   Total Protein 7.3 6.5 - 8.1 g/dL   Albumin 3.6 3.5 - 5.0 g/dL   AST 20 15 - 41 U/L   ALT 15 14 - 54 U/L   Alkaline Phosphatase 105 38 - 126 U/L   Total Bilirubin 0.2 (L) 0.3 - 1.2 mg/dL   Bilirubin, Direct <9.5 (L) 0.1 - 0.5 mg/dL   Indirect Bilirubin NOT CALCULATED 0.3 - 0.9 mg/dL  Lipase, blood  Result Value Ref Range   Lipase 17 (L) 22 - 51 U/L  I-stat troponin, ED  (not at  [facility], [facility])  Result Value Ref Range   Troponin i, poc 0.00 0.00 - 0.08 ng/mL   Comment 3           US Abdomen Complete  10/08/2014   CLINICAL DATA:  Eight days of abdominal and back pain associated with nausea  EXAM: ULTRASOUND ABDOMEN COMPLETE  COMPARISON:  Abdominal CT scan of September 30, 2014  FINDINGS: Gallbladder: The gallbladder is adequately distended. There are echogenic mobile shadowing stones. There is no gallbladder wall thickening or pericholecystic fluid.  Common bile duct: Diameter: 3.6 mm  Liver: No focal lesion identified. Within normal limits in parenchymal echogenicity.  IVC: No abnormality visualized.  Pancreas: Visualized portion unremarkable.  Spleen: Size and appearance within normal limits.  Right Kidney: Length: 11.8 cm. There is mild hydronephrosis on the right. The visualized portions of the right ureter are dilated.  Left Kidney: Length: 12.2 cm. Echogenicity within normal limits. No mass or hydronephrosis visualized.  Abdominal aorta: The distal aorta was obscured by bowel gas. The proximal and mid aorta do not exceed 2.3 cm in diameter.  Other findings: There is no free pelvic fluid.  IMPRESSION: 1. Gallstones without sonographic evidence of acute cholecystitis. 2. Mild right-sided hydronephrosis and proximal hydroureter. The site of obstruction is not demonstrated on  today's study. Correlation with the presence or absence of hematuria is needed.   Electronically Signed   By: David  [place] M.D.   On: 10/08/2014 11:45   Ct Renal Stone Study  09/30/2014   CLINICAL DATA:  Left flank pain, back pain, chest pain, nausea and vomiting developing approximately 1 hour ago when the patient awoke.  EXAM: CT ABDOMEN AND PELVIS WITHOUT CONTRAST  TECHNIQUE: Multidetector CT imaging of the abdomen and pelvis was performed following the standard protocol without IV contrast.  COMPARISON:  None.  FINDINGS: Mild dependent atelectasis is present bilaterally. The heart size is normal. No significant pleural or pericardial effusion is present.  The liver is within normal limits. A hypodense lesion is noted in the spleen measuring 16 x 29 x 29 mm. This is positioned laterally near the lower pole of the spleen in a subcapsular location.  A small hiatal hernia is present. The stomach, duodenum, and pancreas are unremarkable. The common bile duct and gallbladder are within normal limits.  The adrenal glands are normal bilaterally. The kidneys and ureters are within normal limits. There is no significant nephrolithiasis or urinary tract obstruction. The urinary bladder is within normal limits.  The rectosigmoid colon is within normal limits. The descending and distal transverse colon are mostly collapsed. The more proximal colon is within normal limits. The appendix is visualized and normal. Small bowel is unremarkable. The uterus and adnexa are within normal limits for age. No significant adenopathy or free fluid is present.  The bone windows are unremarkable.  IMPRESSION: 1. Small hiatal hernia. 2. 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 3. No acute or focal lesion to explain the patient's symptoms.   Electronically Signed   By: Marin Roberts M.D.   On: 09/30/2014 19:44       EKG Interpretation   Date/Time:  Monday  October 08 2014 07:31:40 EDT Ventricular Rate:  93 PR Interval:  168 QRS Duration: 91 QT Interval:  343 QTC Calculation: 427 R Axis:   26 Text Interpretation:  Sinus rhythm Left atrial enlargement Low voltage,  precordial leads cannot rule out anterior infarct, age undetermined but  noted on previous tracings  No significant change since last tracing  Confirmed by KOHUT  MD, STEPHEN ([id number]) on 10/08/2014 7:37:30 AM      MDM   Iv ns. Labs. Monitor. Ecg.  Reviewed nursing notes and prior charts for additional history.   Reviewed recent ct for same symptoms, neg for acute process.  After gi meds pt reports pain at 8/10. Recheck abd soft, mild upper abd tenderness. Pain persists/requests pain med. Morphine iv. zofran iv.   U/s.  Pt with gallstones.  Discussed u/s w pt incl gallstones, and mild right hydro.  Recent ct neg for kidney stones, and no blood in urine. No gu c/o. And pain bil upper quad to back, felt c/w biliary colic.  Pt refused iv and im pain meds.  Pt drove self to ED and has to drive home.  Motrin po.  Recheck, pain improved. Pt declines additional ed pain med and ready for d/c.  Will give rx ultram and zofran for home.  Discussed close gen surg f/u.    abd soft nt. Pain improved. Afeb. Pt currently appears stable for d/c.      Cathren LaineKevin Merilee Wible, MD 10/08/14 1210

## 2014-10-08 NOTE — Discharge Instructions (Signed)
It was our pleasure to provide your ER care today - we hope that you feel better.  Your ultrasound was read as showing gallstones.  Note was also made of mild right sided hydronephrosis (dilated right kidney collecting system) - have primary care doctor review your recent ct and ultrasound, and follow up with them.  For gallstones, follow up with general surgeon in coming week - see referral - call office to arrange appointment.  Take motrin or aleve as need for pain.  You may also take ultram as need for pain - no driving when taking.  You may take zofran as need for nausea.  Return to ER if worse, new symptoms, worsening or severe pain, fevers, persistent vomiting, other concern.     Cholelithiasis Cholelithiasis (also called gallstones) is a form of gallbladder disease in which gallstones form in your gallbladder. The gallbladder is an organ that stores bile made in the liver, which helps digest fats. Gallstones begin as small crystals and slowly grow into stones. Gallstone pain occurs when the gallbladder spasms and a gallstone is blocking the duct. Pain can also occur when a stone passes out of the duct.  RISK FACTORS  Being female.   Having multiple pregnancies. Health care providers sometimes advise removing diseased gallbladders before future pregnancies.   Being obese.  Eating a diet heavy in fried foods and fat.   Being older than 60 years and increasing age.   Prolonged use of medicines containing female hormones.   Having diabetes mellitus.   Rapidly losing weight.   Having a family history of gallstones (heredity).  SYMPTOMS  Nausea.   Vomiting.  Abdominal pain.   Yellowing of the skin (jaundice).   Sudden pain. It may persist from several minutes to several hours.  Fever.   Tenderness to the touch. In some cases, when gallstones do not move into the bile duct, people have no pain or symptoms. These are called "silent" gallstones.    TREATMENT Silent gallstones do not need treatment. In severe cases, emergency surgery may be required. Options for treatment include:  Surgery to remove the gallbladder. This is the most common treatment.  Medicines. These do not always work and may take 6-12 months or more to work.  Shock wave treatment (extracorporeal biliary lithotripsy). In this treatment an ultrasound machine sends shock waves to the gallbladder to break gallstones into smaller pieces that can pass into the intestines or be dissolved by medicine. HOME CARE INSTRUCTIONS   Only take over-the-counter or prescription medicines for pain, discomfort, or fever as directed by your health care provider.   Follow a low-fat diet until seen again by your health care provider. Fat causes the gallbladder to contract, which can result in pain.   Follow up with your health care provider as directed. Attacks are almost always recurrent and surgery is usually required for permanent treatment.  SEEK IMMEDIATE MEDICAL CARE IF:   Your pain increases and is not controlled by medicines.   You have a fever or persistent symptoms for more than 2-3 days.   You have a fever and your symptoms suddenly get worse.   You have persistent nausea and vomiting.  MAKE SURE YOU:   Understand these instructions.  Will watch your condition.  Will get help right away if you are not doing well or get worse. Document Released: 03/12/2005 Document Revised: 11/16/2012 Document Reviewed: 09/07/2012 [facility] Patient Information 2015 [place], [place]. This information is not intended to replace advice given to you by  your health care provider. Make sure you discuss any questions you have with your health care provider. ° °

## 2014-10-09 ENCOUNTER — Ambulatory Visit: Payer: Self-pay | Admitting: General Surgery

## 2014-10-14 NOTE — ED Provider Notes (Signed)
CSN: [id number]     Arrival date & time 09/30/14  1800 History   First MD Initiated Contact with Patient 09/30/14 1846     Chief Complaint  Patient presents with  . Chest Pain  . Abdominal Pain      HPI Pt presents with c/o chest tightness, bilateral flank pain, and abdominal pain. Pt was seen for the same complaints at [facility] on 7/3. Pt reports that these symptoms woke her up out of her sleep this morning. Pt also reports some nausea as well at this time. Past Medical History  Diagnosis Date  . Migraine    Past Surgical History  Procedure Laterality Date  . Tubal ligation    . Nasal polyp surgery     History reviewed. No pertinent family history. History  Substance Use Topics  . Smoking status: Former Games developer  . Smokeless tobacco: Never Used  . Alcohol Use: No   OB History    No data available     Review of Systems  All other systems reviewed and are negative  Allergies  Hydrocodone; Oxycodone; and Tetracyclines & related  Home Medications   Prior to Admission medications   Medication Sig Start Date End Date Taking? Authorizing Provider  Cyanocobalamin (VITAMIN B-12 CR PO) Take 1 tablet by mouth daily.    Yes Historical Provider, MD  Flaxseed, Linseed, (FLAX SEEDS PO) Take by mouth.   Yes Historical Provider, MD  Multiple Vitamin (MULTIVITAMIN WITH MINERALS) TABS Take 1 tablet by mouth daily.   Yes Historical Provider, MD  almotriptan (AXERT) 12.5 MG tablet Take 12.5 mg by mouth as needed. may repeat in 2 hours if needed    Historical Provider, MD  ibuprofen (ADVIL,MOTRIN) 400 MG tablet Take 400 mg by mouth every 6 (six) hours as needed (pain).     Historical Provider, MD  ondansetron (ZOFRAN ODT) 8 MG disintegrating tablet Take 1 tablet (8 mg total) by mouth every 8 (eight) hours as needed for nausea or vomiting. 10/08/14   Cathren Laine, MD  ondansetron (ZOFRAN) 4 MG tablet Take 1 tablet (4 mg total) by mouth every 6 (six) hours as needed for nausea or vomiting. Patient  not taking: Reported on 10/08/2014 09/30/14   Nelva Nay, MD  oxyCODONE-acetaminophen (PERCOCET) 5-325 MG per tablet Take 1 tablet by mouth every 4 (four) hours as needed. Patient not taking: Reported on 04/11/2014 04/23/13   Dione Booze, MD  traMADol (ULTRAM) 50 MG tablet Take 1 tablet (50 mg total) by mouth every 6 (six) hours as needed. 10/08/14   Cathren Laine, MD   BP 124/80 mmHg  Pulse 87  Temp(Src) 98.6 F (37 C) (Oral)  Resp 15  SpO2 96% Physical Exam  Constitutional: She is oriented to person, place, and time. She appears well-developed and well-nourished. No distress.  HENT:  Head: Normocephalic and atraumatic.  Eyes: Pupils are equal, round, and reactive to light.  Neck: Normal range of motion.  Cardiovascular: Normal rate and intact distal pulses.   Pulmonary/Chest: No respiratory distress.  Abdominal: Normal appearance. She exhibits no distension. There is no tenderness. There is no rebound.  Musculoskeletal: Normal range of motion.  Neurological: She is alert and oriented to person, place, and time. No cranial nerve deficit.  Skin: Skin is warm and dry. No rash noted.  Psychiatric: She has a normal mood and affect. Her behavior is normal.  Nursing note and vitals reviewed.   ED Course  Procedures (including critical care time) Medications  sodium chloride 0.9 %  bolus 500 mL (0 mLs Intravenous Stopped 09/30/14 2151)  ondansetron (ZOFRAN) injection 4 mg (4 mg Intravenous Given 09/30/14 1916)  ketorolac (TORADOL) 30 MG/ML injection 15 mg (15 mg Intravenous Given 09/30/14 1916)  fentaNYL (SUBLIMAZE) injection 100 mcg (100 mcg Intravenous Given 09/30/14 1916)  fentaNYL (SUBLIMAZE) injection 50 mcg (50 mcg Intravenous Given 09/30/14 2219)  ondansetron (ZOFRAN) injection 4 mg (4 mg Intravenous Given 09/30/14 2219)    Labs Review Labs Reviewed  BASIC METABOLIC PANEL - Abnormal; Notable for the following:    Glucose, Bld 161 (*)    All other components within normal limits   URINALYSIS, ROUTINE W REFLEX MICROSCOPIC (NOT AT [facility]) - Abnormal; Notable for the following:    APPearance CLOUDY (*)    All other components within normal limits  CBC  I-STAT TROPOININ, ED    Imaging Review Results for orders placed or performed during the hospital encounter of 09/30/14  CBC  Result Value Ref Range   WBC 5.8 4.0 - 10.5 K/uL   RBC 5.07 3.87 - 5.11 MIL/uL   Hemoglobin 13.7 12.0 - 15.0 g/dL   HCT 40.9 81.1 - 91.4 %   MCV 80.5 78.0 - 100.0 fL   MCH 27.0 26.0 - 34.0 pg   MCHC 33.6 30.0 - 36.0 g/dL   RDW 78.2 95.6 - 21.3 %   Platelets 290 150 - 400 K/uL  Basic metabolic panel  Result Value Ref Range   Sodium 137 135 - 145 mmol/L   Potassium 4.1 3.5 - 5.1 mmol/L   Chloride 102 101 - 111 mmol/L   CO2 25 22 - 32 mmol/L   Glucose, Bld 161 (H) 65 - 99 mg/dL   BUN 12 6 - 20 mg/dL   Creatinine, Ser 0.86 0.44 - 1.00 mg/dL   Calcium 9.0 8.9 - 57.8 mg/dL   GFR calc non Af Amer >60 >60 mL/min   GFR calc Af Amer >60 >60 mL/min   Anion gap 10 5 - 15  Urinalysis, Routine w reflex microscopic (not at [facility])  Result Value Ref Range   Color, Urine YELLOW YELLOW   APPearance CLOUDY (A) CLEAR   Specific Gravity, Urine 1.016 1.005 - 1.030   pH 7.5 5.0 - 8.0   Glucose, UA NEGATIVE NEGATIVE mg/dL   Hgb urine dipstick NEGATIVE NEGATIVE   Bilirubin Urine NEGATIVE NEGATIVE   Ketones, ur NEGATIVE NEGATIVE mg/dL   Protein, ur NEGATIVE NEGATIVE mg/dL   Urobilinogen, UA 0.2 0.0 - 1.0 mg/dL   Nitrite NEGATIVE NEGATIVE   Leukocytes, UA NEGATIVE NEGATIVE  I-stat troponin, ED  (not at [facility])  Result Value Ref Range   Troponin i, poc 0.00 0.00 - 0.08 ng/mL   Comment 3           US Abdomen Complete  10/08/2014   CLINICAL DATA:  Eight days of abdominal and back pain associated with nausea  EXAM: ULTRASOUND ABDOMEN COMPLETE  COMPARISON:  Abdominal CT scan of September 30, 2014  FINDINGS: Gallbladder: The gallbladder is adequately distended. There are echogenic mobile shadowing stones.  There is no gallbladder wall thickening or pericholecystic fluid.  Common bile duct: Diameter: 3.6 mm  Liver: No focal lesion identified. Within normal limits in parenchymal echogenicity.  IVC: No abnormality visualized.  Pancreas: Visualized portion unremarkable.  Spleen: Size and appearance within normal limits.  Right Kidney: Length: 11.8 cm. There is mild hydronephrosis on the right. The visualized portions of the right ureter are dilated.  Left Kidney: Length: 12.2 cm. Echogenicity within  normal limits. No mass or hydronephrosis visualized.  Abdominal aorta: The distal aorta was obscured by bowel gas. The proximal and mid aorta do not exceed 2.3 cm in diameter.  Other findings: There is no free pelvic fluid.  IMPRESSION: 1. Gallstones without sonographic evidence of acute cholecystitis. 2. Mild right-sided hydronephrosis and proximal hydroureter. The site of obstruction is not demonstrated on today's study. Correlation with the presence or absence of hematuria is needed.   Electronically Signed   By: David  SwazilandJordan M.D.   On: 10/08/2014 11:45   Ct Renal Stone Study  09/30/2014   CLINICAL DATA:  Left flank pain, back pain, chest pain, nausea and vomiting developing approximately 1 hour ago when the patient awoke.  EXAM: CT ABDOMEN AND PELVIS WITHOUT CONTRAST  TECHNIQUE: Multidetector CT imaging of the abdomen and pelvis was performed following the standard protocol without IV contrast.  COMPARISON:  None.  FINDINGS: Mild dependent atelectasis is present bilaterally. The heart size is normal. No significant pleural or pericardial effusion is present.  The liver is within normal limits. A hypodense lesion is noted in the spleen measuring 16 x 29 x 29 mm. This is positioned laterally near the lower pole of the spleen in a subcapsular location.  A small hiatal hernia is present. The stomach, duodenum, and pancreas are unremarkable. The common bile duct and gallbladder are within normal limits.  The adrenal glands  are normal bilaterally. The kidneys and ureters are within normal limits. There is no significant nephrolithiasis or urinary tract obstruction. The urinary bladder is within normal limits.  The rectosigmoid colon is within normal limits. The descending and distal transverse colon are mostly collapsed. The more proximal colon is within normal limits. The appendix is visualized and normal. Small bowel is unremarkable. The uterus and adnexa are within normal limits for age. No significant adenopathy or free fluid is present.  The bone windows are unremarkable.  IMPRESSION: 1. Small hiatal hernia. 2. 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 3. No acute or focal lesion to explain the patient's symptoms.   Electronically Signed   By: Marin Robertshristopher  Mattern M.D.   On: 09/30/2014 19:44       EKG Interpretation   Date/Time:  Sunday September 30 2014 18:04:25 EDT Ventricular Rate:  94 PR Interval:  162 QRS Duration: 88 QT Interval:  350 QTC Calculation: 437 R Axis:   43 Text Interpretation:  Normal sinus rhythm Possible Left atrial enlargement  Cannot rule out Anterior infarct , age undetermined Abnormal ECG No  significant change since last tracing Confirmed by Radford PaxBEATON  MD, Reannah Totten  [phone]([id number]) on 09/30/2014 6:57:57 PM      MDM   Final diagnoses:  Left flank discomfort        Nelva Nayobert Arisbeth Purrington, MD 10/14/14 [phone]

## 2014-10-18 ENCOUNTER — Encounter (HOSPITAL_COMMUNITY): Payer: Self-pay | Admitting: *Deleted

## 2014-10-18 MED ORDER — DEXTROSE 5 % IV SOLN
2.0000 g | INTRAVENOUS | Status: AC
  Administered 2014-10-19: 2 g via INTRAVENOUS
  Filled 2014-10-18 (×2): qty 2

## 2014-10-18 NOTE — Progress Notes (Signed)
Pt denies any cardiac history. 

## 2014-10-19 ENCOUNTER — Ambulatory Visit (HOSPITAL_COMMUNITY): Payer: BLUE CROSS/BLUE SHIELD | Admitting: Anesthesiology

## 2014-10-19 ENCOUNTER — Ambulatory Visit (HOSPITAL_COMMUNITY): Payer: BLUE CROSS/BLUE SHIELD

## 2014-10-19 ENCOUNTER — Encounter (HOSPITAL_COMMUNITY)
Admission: RE | Disposition: A | Discharge status: Home / Self Care | Payer: Self-pay | Source: Ambulatory Visit | Attending: General Surgery

## 2014-10-19 ENCOUNTER — Encounter (HOSPITAL_COMMUNITY): Payer: Self-pay | Admitting: Anesthesiology

## 2014-10-19 ENCOUNTER — Ambulatory Visit (HOSPITAL_COMMUNITY)
Admission: RE | Admit: 2014-10-19 | Discharge: 2014-10-20 | Disposition: A | Discharge status: Home / Self Care | Payer: BLUE CROSS/BLUE SHIELD | Source: Ambulatory Visit | Attending: General Surgery | Admitting: General Surgery

## 2014-10-19 DIAGNOSIS — K8010 Calculus of gallbladder with chronic cholecystitis without obstruction: Secondary | ICD-10-CM | POA: Diagnosis not present

## 2014-10-19 DIAGNOSIS — Z9049 Acquired absence of other specified parts of digestive tract: Secondary | ICD-10-CM

## 2014-10-19 DIAGNOSIS — K8020 Calculus of gallbladder without cholecystitis without obstruction: Secondary | ICD-10-CM | POA: Diagnosis present

## 2014-10-19 DIAGNOSIS — Z01818 Encounter for other preprocedural examination: Secondary | ICD-10-CM

## 2014-10-19 HISTORY — DX: Pneumonia, unspecified organism: J18.9

## 2014-10-19 HISTORY — DX: Nausea with vomiting, unspecified: Z98.890

## 2014-10-19 HISTORY — DX: Unspecified osteoarthritis, unspecified site: M19.90

## 2014-10-19 HISTORY — DX: Noninfective gastroenteritis and colitis, unspecified: K52.9

## 2014-10-19 HISTORY — DX: Other specified postprocedural states: R11.2

## 2014-10-19 HISTORY — DX: Personal history of other diseases of the digestive system: Z87.19

## 2014-10-19 HISTORY — DX: Other complications of anesthesia, initial encounter: T88.59XA

## 2014-10-19 HISTORY — PX: CHOLECYSTECTOMY: SHX55

## 2014-10-19 HISTORY — DX: Anemia, unspecified: D64.9

## 2014-10-19 HISTORY — DX: Adverse effect of unspecified anesthetic, initial encounter: T41.45XA

## 2014-10-19 LAB — CBC WITH DIFFERENTIAL/PLATELET
BASOS PCT: 1 % (ref 0–1)
Basophils Absolute: 0.1 10*3/uL (ref 0.0–0.1)
Eosinophils Absolute: 0.2 10*3/uL (ref 0.0–0.7)
Eosinophils Relative: 4 % (ref 0–5)
HEMATOCRIT: 41.7 % (ref 36.0–46.0)
Hemoglobin: 14.1 g/dL (ref 12.0–15.0)
Lymphocytes Relative: 36 % (ref 12–46)
Lymphs Abs: 1.8 10*3/uL (ref 0.7–4.0)
MCH: 27.2 pg (ref 26.0–34.0)
MCHC: 33.8 g/dL (ref 30.0–36.0)
MCV: 80.5 fL (ref 78.0–100.0)
MONOS PCT: 11 % (ref 3–12)
Monocytes Absolute: 0.5 10*3/uL (ref 0.1–1.0)
NEUTROS PCT: 48 % (ref 43–77)
Neutro Abs: 2.4 10*3/uL (ref 1.7–7.7)
Platelets: 307 10*3/uL (ref 150–400)
RBC: 5.18 MIL/uL — AB (ref 3.87–5.11)
RDW: 13.5 % (ref 11.5–15.5)
WBC: 4.9 10*3/uL (ref 4.0–10.5)

## 2014-10-19 LAB — COMPREHENSIVE METABOLIC PANEL
ALBUMIN: 3.7 g/dL (ref 3.5–5.0)
ALT: 16 U/L (ref 14–54)
AST: 31 U/L (ref 15–41)
Alkaline Phosphatase: 93 U/L (ref 38–126)
Anion gap: 10 (ref 5–15)
BUN: 9 mg/dL (ref 6–20)
CO2: 25 mmol/L (ref 22–32)
Calcium: 9.1 mg/dL (ref 8.9–10.3)
Chloride: 106 mmol/L (ref 101–111)
Creatinine, Ser: 0.72 mg/dL (ref 0.44–1.00)
GFR calc non Af Amer: >60 mL/min (ref >60)
Glucose, Bld: 126 mg/dL — ABNORMAL HIGH (ref 65–99)
Potassium: 4.8 mmol/L (ref 3.5–5.1)
SODIUM: 141 mmol/L (ref 135–145)
TOTAL PROTEIN: 7.4 g/dL (ref 6.5–8.1)
Total Bilirubin: 1 mg/dL (ref 0.3–1.2)

## 2014-10-19 LAB — CBC
HCT: 38.8 % (ref 36.0–46.0)
Hemoglobin: 12.9 g/dL (ref 12.0–15.0)
MCH: 26.6 pg (ref 26.0–34.0)
MCHC: 33.2 g/dL (ref 30.0–36.0)
MCV: 80 fL (ref 78.0–100.0)
PLATELETS: 307 10*3/uL (ref 150–400)
RBC: 4.85 MIL/uL (ref 3.87–5.11)
RDW: 13.5 % (ref 11.5–15.5)
WBC: 9.1 10*3/uL (ref 4.0–10.5)

## 2014-10-19 LAB — CREATININE, SERUM: CREATININE: 0.8 mg/dL (ref 0.44–1.00)

## 2014-10-19 SURGERY — LAPAROSCOPIC CHOLECYSTECTOMY
Anesthesia: General | Site: Abdomen

## 2014-10-19 MED ORDER — OXYCODONE-ACETAMINOPHEN 5-325 MG PO TABS: 1.0000 | ORAL_TABLET | Freq: Four times a day (QID) | ORAL | Status: DC | PRN

## 2014-10-19 MED ORDER — NEOSTIGMINE METHYLSULFATE 10 MG/10ML IV SOLN
INTRAVENOUS | Status: DC | PRN
  Administered 2014-10-19: 3 mg via INTRAVENOUS

## 2014-10-19 MED ORDER — SUCCINYLCHOLINE CHLORIDE 20 MG/ML IJ SOLN
INTRAMUSCULAR | Status: DC | PRN
  Administered 2014-10-19: 100 mg via INTRAVENOUS

## 2014-10-19 MED ORDER — PROMETHAZINE HCL 25 MG/ML IJ SOLN
6.2500 mg | Freq: Four times a day (QID) | INTRAMUSCULAR | Status: DC | PRN
Start: 2014-10-19 — End: 2014-10-20

## 2014-10-19 MED ORDER — HYDROMORPHONE HCL 1 MG/ML IJ SOLN
INTRAMUSCULAR | Status: AC
  Filled 2014-10-19: qty 1

## 2014-10-19 MED ORDER — MIDAZOLAM HCL 5 MG/5ML IJ SOLN
INTRAMUSCULAR | Status: DC | PRN
Start: 2014-10-19 — End: 2014-10-19
  Administered 2014-10-19: 1 mg via INTRAVENOUS

## 2014-10-19 MED ORDER — DEXAMETHASONE SODIUM PHOSPHATE 4 MG/ML IJ SOLN
INTRAMUSCULAR | Status: DC | PRN
  Administered 2014-10-19: 8 mg via INTRAVENOUS

## 2014-10-19 MED ORDER — SODIUM CHLORIDE 0.9 % IJ SOLN
INTRAMUSCULAR | Status: AC
  Filled 2014-10-19: qty 10

## 2014-10-19 MED ORDER — 0.9 % SODIUM CHLORIDE (POUR BTL) OPTIME
TOPICAL | Status: DC | PRN
  Administered 2014-10-19: 1000 mL

## 2014-10-19 MED ORDER — HYDROMORPHONE HCL 1 MG/ML IJ SOLN
0.5000 mg | INTRAMUSCULAR | Status: AC | PRN
  Administered 2014-10-19 (×4): 0.5 mg via INTRAVENOUS

## 2014-10-19 MED ORDER — NEOSTIGMINE METHYLSULFATE 10 MG/10ML IV SOLN
INTRAVENOUS | Status: AC
  Filled 2014-10-19: qty 1

## 2014-10-19 MED ORDER — HYDROMORPHONE HCL 1 MG/ML IJ SOLN
0.5000 mg | Freq: Once | INTRAMUSCULAR | Status: AC
  Administered 2014-10-19: 0.5 mg via INTRAVENOUS

## 2014-10-19 MED ORDER — LIDOCAINE HCL (CARDIAC) 20 MG/ML IV SOLN
INTRAVENOUS | Status: AC
  Filled 2014-10-19: qty 5

## 2014-10-19 MED ORDER — ONDANSETRON HCL 4 MG/2ML IJ SOLN
INTRAMUSCULAR | Status: AC
  Filled 2014-10-19: qty 2

## 2014-10-19 MED ORDER — PROPOFOL 10 MG/ML IV BOLUS
INTRAVENOUS | Status: DC | PRN
  Administered 2014-10-19: 200 mg via INTRAVENOUS

## 2014-10-19 MED ORDER — PROMETHAZINE HCL 25 MG/ML IJ SOLN
6.2500 mg | Freq: Four times a day (QID) | INTRAMUSCULAR | Status: DC | PRN
  Administered 2014-10-19: 12.5 mg via INTRAVENOUS

## 2014-10-19 MED ORDER — GLYCOPYRROLATE 0.2 MG/ML IJ SOLN
INTRAMUSCULAR | Status: DC | PRN
  Administered 2014-10-19: 0.4 mg via INTRAVENOUS

## 2014-10-19 MED ORDER — ROCURONIUM BROMIDE 100 MG/10ML IV SOLN
INTRAVENOUS | Status: DC | PRN
Start: 2014-10-19 — End: 2014-10-19
  Administered 2014-10-19: 30 mg via INTRAVENOUS

## 2014-10-19 MED ORDER — BUPIVACAINE-EPINEPHRINE 0.25% -1:200000 IJ SOLN
INTRAMUSCULAR | Status: DC | PRN
  Administered 2014-10-19: 17 mL

## 2014-10-19 MED ORDER — ONDANSETRON HCL 4 MG/2ML IJ SOLN
INTRAMUSCULAR | Status: DC | PRN
  Administered 2014-10-19: 4 mg via INTRAVENOUS

## 2014-10-19 MED ORDER — IBUPROFEN 800 MG PO TABS
800.0000 mg | ORAL_TABLET | Freq: Three times a day (TID) | ORAL | Status: DC | PRN
  Administered 2014-10-19: 800 mg via ORAL
  Filled 2014-10-19: qty 1

## 2014-10-19 MED ORDER — ACETAMINOPHEN 325 MG PO TABS: 650.0000 mg | ORAL_TABLET | ORAL | Status: DC | PRN

## 2014-10-19 MED ORDER — LIDOCAINE HCL (CARDIAC) 20 MG/ML IV SOLN
INTRAVENOUS | Status: DC | PRN
  Administered 2014-10-19: 80 mg via INTRAVENOUS

## 2014-10-19 MED ORDER — FENTANYL CITRATE (PF) 100 MCG/2ML IJ SOLN
INTRAMUSCULAR | Status: DC | PRN
  Administered 2014-10-19: 100 ug via INTRAVENOUS
  Administered 2014-10-19: 50 ug via INTRAVENOUS
  Administered 2014-10-19: 100 ug via INTRAVENOUS

## 2014-10-19 MED ORDER — DIPHENHYDRAMINE HCL 50 MG/ML IJ SOLN
6.2500 mg | Freq: Once | INTRAMUSCULAR | Status: AC
  Administered 2014-10-19: 6.25 mg via INTRAVENOUS

## 2014-10-19 MED ORDER — EPHEDRINE SULFATE 50 MG/ML IJ SOLN
INTRAMUSCULAR | Status: AC
  Filled 2014-10-19: qty 1

## 2014-10-19 MED ORDER — MIDAZOLAM HCL 2 MG/2ML IJ SOLN
INTRAMUSCULAR | Status: AC
  Filled 2014-10-19: qty 2

## 2014-10-19 MED ORDER — ENOXAPARIN SODIUM 40 MG/0.4ML ~~LOC~~ SOLN
40.0000 mg | SUBCUTANEOUS | Status: DC
Start: 2014-10-20 — End: 2014-10-20
  Administered 2014-10-20: 40 mg via SUBCUTANEOUS
  Filled 2014-10-19: qty 0.4

## 2014-10-19 MED ORDER — ONDANSETRON HCL 4 MG/2ML IJ SOLN
INTRAMUSCULAR | Status: AC
Start: 2014-10-19 — End: 2014-10-20
  Filled 2014-10-19: qty 2

## 2014-10-19 MED ORDER — BUPIVACAINE-EPINEPHRINE (PF) 0.25% -1:200000 IJ SOLN
INTRAMUSCULAR | Status: AC
  Filled 2014-10-19: qty 30

## 2014-10-19 MED ORDER — SUCCINYLCHOLINE CHLORIDE 20 MG/ML IJ SOLN
INTRAMUSCULAR | Status: AC
  Filled 2014-10-19: qty 1

## 2014-10-19 MED ORDER — HYDROMORPHONE HCL 2 MG PO TABS
2.0000 mg | ORAL_TABLET | ORAL | Status: DC | PRN
  Administered 2014-10-19 (×2): 2 mg via ORAL
  Filled 2014-10-19 (×2): qty 1

## 2014-10-19 MED ORDER — ONDANSETRON HCL 4 MG/2ML IJ SOLN
4.0000 mg | Freq: Once | INTRAMUSCULAR | Status: AC | PRN
  Administered 2014-10-19: 4 mg via INTRAVENOUS

## 2014-10-19 MED ORDER — POTASSIUM CHLORIDE IN NACL 20-0.9 MEQ/L-% IV SOLN
INTRAVENOUS | Status: DC
  Administered 2014-10-19: 18:00:00 via INTRAVENOUS
  Filled 2014-10-19 (×2): qty 1000

## 2014-10-19 MED ORDER — DIPHENHYDRAMINE HCL 50 MG/ML IJ SOLN
INTRAMUSCULAR | Status: AC
  Filled 2014-10-19: qty 1

## 2014-10-19 MED ORDER — SODIUM CHLORIDE 0.9 % IR SOLN
Status: DC | PRN
  Administered 2014-10-19: 1

## 2014-10-19 MED ORDER — LACTATED RINGERS IV SOLN
INTRAVENOUS | Status: DC | PRN
  Administered 2014-10-19 (×2): via INTRAVENOUS

## 2014-10-19 MED ORDER — PROMETHAZINE HCL 25 MG/ML IJ SOLN
INTRAMUSCULAR | Status: AC
  Filled 2014-10-19: qty 1

## 2014-10-19 MED ORDER — PHENYLEPHRINE 40 MCG/ML (10ML) SYRINGE FOR IV PUSH (FOR BLOOD PRESSURE SUPPORT)
PREFILLED_SYRINGE | INTRAVENOUS | Status: AC
  Filled 2014-10-19: qty 10

## 2014-10-19 MED ORDER — DEXAMETHASONE SODIUM PHOSPHATE 4 MG/ML IJ SOLN
INTRAMUSCULAR | Status: AC
  Filled 2014-10-19: qty 2

## 2014-10-19 MED ORDER — PROPOFOL 10 MG/ML IV BOLUS
INTRAVENOUS | Status: AC
  Filled 2014-10-19: qty 20

## 2014-10-19 MED ORDER — GLYCOPYRROLATE 0.2 MG/ML IJ SOLN
INTRAMUSCULAR | Status: AC
  Filled 2014-10-19: qty 2

## 2014-10-19 MED ORDER — TRAMADOL HCL 50 MG PO TABS: 50.0000 mg | ORAL_TABLET | Freq: Four times a day (QID) | ORAL | Status: DC | PRN

## 2014-10-19 MED ORDER — FENTANYL CITRATE (PF) 250 MCG/5ML IJ SOLN
INTRAMUSCULAR | Status: AC
  Filled 2014-10-19: qty 5

## 2014-10-19 MED ORDER — ROCURONIUM BROMIDE 50 MG/5ML IV SOLN
INTRAVENOUS | Status: AC
  Filled 2014-10-19: qty 1

## 2014-10-19 MED ORDER — CHLORHEXIDINE GLUCONATE 4 % EX LIQD: 1.0000 "application " | Freq: Once | CUTANEOUS | Status: DC

## 2014-10-19 SURGICAL SUPPLY — 46 items
ADH SKN CLS APL DERMABOND .7 (GAUZE/BANDAGES/DRESSINGS) ×2
APPLIER CLIP 5 13 M/L LIGAMAX5 (MISCELLANEOUS) ×4
APR CLP MED LRG 5 ANG JAW (MISCELLANEOUS) ×2
BAG SPEC RTRVL LRG 6X4 10 (ENDOMECHANICALS)
BLADE SURG ROTATE 9660 (MISCELLANEOUS) IMPLANT
CANISTER SUCTION 2500CC (MISCELLANEOUS) ×4 IMPLANT
CHLORAPREP W/TINT 26ML (MISCELLANEOUS) ×4 IMPLANT
CLIP APPLIE 5 13 M/L LIGAMAX5 (MISCELLANEOUS) ×2 IMPLANT
CLOSURE STERI-STRIP 1/2X4 (GAUZE/BANDAGES/DRESSINGS) ×1
CLSR STERI-STRIP ANTIMIC 1/2X4 (GAUZE/BANDAGES/DRESSINGS) ×2 IMPLANT
COVER MAYO STAND STRL (DRAPES) ×4 IMPLANT
COVER SURGICAL LIGHT HANDLE (MISCELLANEOUS) ×4 IMPLANT
DERMABOND ADVANCED (GAUZE/BANDAGES/DRESSINGS) ×2
DERMABOND ADVANCED .7 DNX12 (GAUZE/BANDAGES/DRESSINGS) ×2 IMPLANT
DRAPE C-ARM 42X72 X-RAY (DRAPES) ×4 IMPLANT
DRSG TEGADERM 2-3/8X2-3/4 SM (GAUZE/BANDAGES/DRESSINGS) ×7 IMPLANT
ELECT REM PT RETURN 9FT ADLT (ELECTROSURGICAL) ×4
ELECTRODE REM PT RTRN 9FT ADLT (ELECTROSURGICAL) ×2 IMPLANT
GLOVE BIOGEL PI IND STRL 7.0 (GLOVE) ×1 IMPLANT
GLOVE BIOGEL PI IND STRL 7.5 (GLOVE) ×2 IMPLANT
GLOVE BIOGEL PI IND STRL 8 (GLOVE) ×2 IMPLANT
GLOVE BIOGEL PI INDICATOR 7.0 (GLOVE) ×2
GLOVE BIOGEL PI INDICATOR 7.5 (GLOVE) ×4
GLOVE BIOGEL PI INDICATOR 8 (GLOVE) ×2
GLOVE ECLIPSE 7.5 STRL STRAW (GLOVE) ×4 IMPLANT
GLOVE SURG SS PI 7.0 STRL IVOR (GLOVE) ×3 IMPLANT
GLOVE SURG SS PI 7.5 STRL IVOR (GLOVE) ×3 IMPLANT
GOWN STRL REUS W/ TWL LRG LVL3 (GOWN DISPOSABLE) ×6 IMPLANT
GOWN STRL REUS W/TWL LRG LVL3 (GOWN DISPOSABLE) ×12
KIT BASIN OR (CUSTOM PROCEDURE TRAY) ×4 IMPLANT
KIT ROOM TURNOVER OR (KITS) ×4 IMPLANT
NS IRRIG 1000ML POUR BTL (IV SOLUTION) ×4 IMPLANT
PAD ARMBOARD 7.5X6 YLW CONV (MISCELLANEOUS) ×4 IMPLANT
POUCH SPECIMEN RETRIEVAL 10MM (ENDOMECHANICALS) IMPLANT
SCISSORS LAP 5X35 DISP (ENDOMECHANICALS) ×4 IMPLANT
SET CHOLANGIOGRAPH 5 50 .035 (SET/KITS/TRAYS/PACK) ×4 IMPLANT
SET IRRIG TUBING LAPAROSCOPIC (IRRIGATION / IRRIGATOR) ×4 IMPLANT
SLEEVE ENDOPATH XCEL 5M (ENDOMECHANICALS) ×8 IMPLANT
SPECIMEN JAR SMALL (MISCELLANEOUS) ×4 IMPLANT
SUT MNCRL AB 4-0 PS2 18 (SUTURE) ×4 IMPLANT
TOWEL OR 17X24 6PK STRL BLUE (TOWEL DISPOSABLE) ×4 IMPLANT
TOWEL OR 17X26 10 PK STRL BLUE (TOWEL DISPOSABLE) ×4 IMPLANT
TRAY LAPAROSCOPIC MC (CUSTOM PROCEDURE TRAY) ×4 IMPLANT
TROCAR XCEL BLUNT TIP 100MML (ENDOMECHANICALS) ×4 IMPLANT
TROCAR XCEL NON-BLD 5MMX100MML (ENDOMECHANICALS) ×4 IMPLANT
TUBING INSUFFLATION (TUBING) ×4 IMPLANT

## 2014-10-19 NOTE — Anesthesia Postprocedure Evaluation (Signed)
  Anesthesia Post-op Note  Patient: Jacqueline Fischer  Procedure(s) Performed: Procedure(s): LAPAROSCOPIC CHOLECYSTECTOMY (N/A)  Patient Location: PACU  Anesthesia Type:General  Level of Consciousness: awake, alert , oriented and patient cooperative  Airway and Oxygen Therapy: Patient Spontanous Breathing  Post-op Pain: mild  Post-op Assessment: Post-op Vital signs reviewed, Patient's Cardiovascular Status Stable, Respiratory Function Stable, Patent Airway, No signs of Nausea or vomiting and Pain level controlled              Post-op Vital Signs: stable  Last Vitals:  Filed Vitals:   10/19/14 0926  BP: 147/76  Pulse: 71  Temp:   Resp: 12    Complications: No apparent anesthesia complications

## 2014-10-19 NOTE — Progress Notes (Signed)
Called and spoke with Norris Cross., PA about patient being extremely nauseated after surgery, despite being given Zofran  IV.  Tresa Endo, PA came and assessed the patient and determined patient needed to be admitted overnight for observation.  Patient agrees.  Will call and give report to RN on [employer].  Will continue to monitor patient.

## 2014-10-19 NOTE — Transfer of Care (Signed)
Immediate Anesthesia Transfer of Care Note  Patient: Jacqueline Fischer  Procedure(s) Performed: Procedure(s): LAPAROSCOPIC CHOLECYSTECTOMY (N/A)  Patient Location: PACU  Anesthesia Type:General  Level of Consciousness: responds to stimulation  Airway & Oxygen Therapy: Patient Spontanous Breathing and Patient connected to face mask oxygen  Post-op Assessment: Report given to RN and Post -op Vital signs reviewed and stable  Post vital signs: Reviewed and stable  Last Vitals:  Filed Vitals:   10/19/14 0627  BP: 162/74  Pulse: 84  Temp: 36.7 C  Resp: 18    Complications: No apparent anesthesia complications

## 2014-10-19 NOTE — Anesthesia Preprocedure Evaluation (Signed)
Anesthesia Evaluation  Patient identified by MRN, date of birth, ID band Patient awake    Reviewed: Allergy & Precautions, NPO status , Patient's Chart, lab work & pertinent test results  Airway Mallampati: I       Dental   Pulmonary former smoker,    Pulmonary exam normal       Cardiovascular Normal cardiovascular exam    Neuro/Psych  Headaches,    GI/Hepatic hiatal hernia,   Endo/Other    Renal/GU      Musculoskeletal  (+) Arthritis -,   Abdominal   Peds  Hematology  (+) anemia ,   Anesthesia Other Findings   Reproductive/Obstetrics                             Anesthesia Physical Anesthesia Plan  ASA: II  Anesthesia Plan: General   Post-op Pain Management:    Induction: Intravenous  Airway Management Planned: Oral ETT  Additional Equipment:   Intra-op Plan:   Post-operative Plan: Extubation in OR  Informed Consent: I have reviewed the patients History and Physical, chart, labs and discussed the procedure including the risks, benefits and alternatives for the proposed anesthesia with the patient or authorized representative who has indicated his/her understanding and acceptance.     Plan Discussed with: CRNA, Anesthesiologist and Surgeon  Anesthesia Plan Comments:         Anesthesia Quick Evaluation

## 2014-10-19 NOTE — H&P (Signed)
Jacqueline Fischer. Doutt 10/09/2014 11:59 AM Location: [facility] Patient #: [id number] DOB: 1956/02/02 Divorced / Language: [place] / Race: Black or African American Female  History of Present Illness Jacqueline Fischer; 10/09/2014 12:01 PM) Patient words: gallbladder.  The patient is a 59 year old female    Other Problems Jacqueline Fischer, Fischer; 10/09/2014 12:00 PM) Back Pain Gastric Ulcer General anesthesia - complications Migraine Headache Other disease, cancer, significant illness Ulcerative Colitis  Past Surgical History Jacqueline Fischer, Fischer; 10/09/2014 12:00 PM) Colon Polyp Removal - Colonoscopy Oral Surgery  Diagnostic Studies History Jacqueline Fischer, Fischer; 10/09/2014 12:00 PM) Colonoscopy 5-10 years ago Mammogram 1-3 years ago Pap Smear 1-5 years ago  Allergies Jacqueline Fischer, Fischer; 10/09/2014 12:01 PM) Oxycodone-Acetaminophen *ANALGESICS - OPIOID* Tetracycline HCl *TETRACYCLINES*  Medication History Jacqueline Fischer, Fischer; 10/09/2014 12:02 PM) No Current Medications Medications Reconciled  Social History Jacqueline Fischer, Fischer; 10/09/2014 12:00 PM) Alcohol use Occasional alcohol use. Caffeine use Tea. No drug use Tobacco use Former smoker.  Family History Jacqueline Fischer, Fischer; 10/09/2014 12:00 PM) Arthritis Father, Mother. Bleeding disorder Mother. Cerebrovascular Accident Brother. Diabetes Mellitus Brother, Mother. Heart Disease Father. Heart disease in female family member before age 50 Hypertension Brother, Father, Mother, Sister. Kidney Disease Mother. Prostate Cancer Father.  Pregnancy / Birth History Jacqueline Fischer, Fischer; 10/09/2014 12:00 PM) Age at menarche 13 years. Age of menopause 22-60 Gravida 2 Maternal age 68-30 Para 2  Review of Systems Jacqueline Fischer; 10/09/2014 12:00 PM) General Present- Fatigue. Not Present- Appetite Loss, Chills, Fever, Night Sweats, Weight Gain and Weight Loss. Skin Present- Dryness. Not Present- Change in  Wart/Mole, Hives, Jaundice, New Lesions, Non-Healing Wounds, Rash and Ulcer. HEENT Present- Wears glasses/contact lenses. Not Present- Earache, Hearing Loss, Hoarseness, Nose Bleed, Oral Ulcers, Ringing in the Ears, Seasonal Allergies, Sinus Pain, Sore Throat, Visual Disturbances and Yellow Eyes. Cardiovascular Not Present- Chest Pain, Difficulty Breathing Lying Down, Leg Cramps, Palpitations, Rapid Heart Rate, Shortness of Breath and Swelling of Extremities. Gastrointestinal Present- Abdominal Pain, Excessive gas, Nausea and Vomiting. Not Present- Bloating, Bloody Stool, Change in Bowel Habits, Chronic diarrhea, Constipation, Difficulty Swallowing, Gets full quickly at meals, Hemorrhoids, Indigestion and Rectal Pain. Female Genitourinary Not Present- Frequency, Nocturia, Painful Urination, Pelvic Pain and Urgency. Neurological Present- Headaches. Not Present- Decreased Memory, Fainting, Numbness, Seizures, Tingling, Tremor, Trouble walking and Weakness. Psychiatric Not Present- Anxiety, Bipolar, Change in Sleep Pattern, Depression, Fearful and Frequent crying. Endocrine Present- Hair Changes and Hot flashes. Not Present- Cold Intolerance, Excessive Hunger, Heat Intolerance and New Diabetes. Hematology Not Present- Easy Bruising, Excessive bleeding, Gland problems, HIV and Persistent Infections.   Vitals (Jacqueline Fischer; 10/09/2014 12:01 PM) 10/09/2014 12:00 PM Weight: 178 lb Height: 63in Body Surface Area: 1.89 m Body Mass Index: 31.53 kg/m Temp.: 97.63F(Temporal)  Pulse: 79 (Regular)  BP: 130/80 (Sitting, Left Arm, Standard)    Physical Exam (Jacqueline Nickolson O. Lindie Spruce MD; 10/09/2014 12:28 PM) General Mental Status-Alert. General Appearance-Cooperative and Well groomed. Orientation-Oriented X4.  Chest and Lung Exam Chest and lung exam reveals -normal excursion with symmetric chest walls, normal resonance, no flatness or dullness, non-tender and normal tactile fremitus and on  auscultation, normal breath sounds, no adventitious sounds and normal vocal resonance.  Cardiovascular Cardiovascular examination reveals -on palpation PMI is normal in location and amplitude, no palpable S3 or S4. Normal cardiac borders., normal heart sounds, regular rate and rhythm with no murmurs and (see Vital Signs section for blood pressure measurements).  Abdomen Inspection Inspection of the abdomen reveals - No Visible peristalsis.  Palpation/Percussion Palpation and Percussion of the abdomen reveal - Soft, Non Tender, No Rigidity (guarding) and No Palpable abdominal masses. Auscultation Auscultation of the abdomen reveals - Bowel sounds normal.    Assessment & Plan Jacqueline Fearing O. Kyrah Schiro MD; 10/09/2014 12:33 PM) SYMPTOMATIC CHOLELITHIASIS (574.20  K80.20) Impression: Multiple attacks of epigastric and upper abdominal pain with nause and vomiting. US shows gallstones. Likely symptomatic cholelithiasis. Will plan for surgey ASAP Current Plans  Pt Education - Gallstones: discussed with patient and provided information.  LFTs are normal.  May not need to perform cholangiogram.  Jacqueline Lamas. Gae Bon, MD, FACS [phone] [facility] Pt Education - Uncomplicated gallstone disease in adults: discussed with patient and provided information.

## 2014-10-19 NOTE — Op Note (Signed)
OPERATIVE REPORT  DATE OF OPERATION: 10/19/2014  PATIENT:  Jacqueline Fischer  59 y.o. female  PRE-OPERATIVE DIAGNOSIS:  Symptomatic cholelithiasis  POST-OPERATIVE DIAGNOSIS:  Symptomatic cholelithiasis  PROCEDURE:  Procedure(s): LAPAROSCOPIC CHOLECYSTECTOMY  SURGEON:  Surgeon(s): Jimmye Norman, MD  ASSISTANTOrson Slick, RNFA  ANESTHESIA:   general  EBL: <20 ml  BLOOD ADMINISTERED: none  DRAINS: none   SPECIMEN:  Source of Specimen:  Gallbladder and contents  COUNTS CORRECT:  YES  PROCEDURE DETAILS: The patient was taken to the operating room and placed on the table in the supine position.  After an adequate endotracheal anesthetic was administered, the patient was prepped with ChloroPrep, and then draped in the usual manner exposing the entire abdomen laterally, inferiorly and up  to the costal margins.  After a proper timeout was performed including identifying the patient and the procedure to be performed, a supraumbilical 1.5cm midline incision was made using a #15 blade.  This was taken down to the fascia which was then incised with a #15 blade.  The edges of the fascia were tented up with Kocher clamps as the preperitoneal space was penetrated with a Kelly clamp into the peritoneum.  Once this was done, a pursestring suture of 0 Vicryl was passed around the fascial opening.  This was subsequently used to secure the [facility] cannula which was passed into the peritoneal cavity.  Once the [facility] cannula was in place, carbon dioxide gas was insufflated into the peritoneal cavity up to a maximal intra-abdominal pressure of 15mm Hg.The laparoscope, with attached camera and light source, was passed into the peritoneal cavity to visualize the direct insertion of two right upper quadrant 5mm cannulas, and a sup-xiphoid 5mm cannula.  Once all cannulas were in place, the dissection was begun.  The patient had a lot of fatty adhesions to the gallbladder body and infundibulum that had to be  removed prior to getting to the primary structures.  Two ratcheted graspers were attached to the dome and infundibulum of the gallbladder and retracted towards the anterior abdominal wall and the right upper quadrant.  Using cautery attached to a dissecting forceps, the peritoneum overlaying the triangle of Chalot and the hepatoduodenal triangle was dissected away exposing the cystic duct and the cystic artery.  A critical window was developed between the CBD and the cystic duct The cystic artery was coursing along with the cystic duct and not in the usual position of the triangle of Chalot..  A critical view was developed between the gallbladder, the cystic duct and the CBD.  A clip was placed on the gallbladder side of the cystic duct, then the distal cystic duct was clipped multiple times then transected between the clips.  The gallbladder was then dissected out of the hepatic bed without event.  It was retrieved from the abdomen (using an EndoCatch bag) without event.  Once the gallbladder was removed, the bed was inspected for hemostasis.  Once excellent hemostasis was obtained all gas and fluids were aspirated from above the liver, then the cannulas were removed.  The supraumbilical incision was closed using the pursestring suture which was in place.  0.25% bupivicaine with epinephrine was injected at all sites.  All 10mm or greater cannula sites were close using a running subcuticular stitch of 4-0 Monocryl.  5.87mm cannula sites were closed with Dermabond only.Steri-Strips and Tagaderm were used to complete the dressings at all sites.  At this point all needle, sponge, and instrument counts were correct.The patient was awakened from anesthesia and  taken to the PACU in stable condition.   PATIENT DISPOSITION:  PACU - hemodynamically stable.   Olaf Mesa 7/22/20168:28 AM

## 2014-10-19 NOTE — Anesthesia Procedure Notes (Signed)
Procedure Name: Intubation Date/Time: 10/19/2014 7:25 AM Performed by: Arlice Colt B Pre-anesthesia Checklist: Patient identified, Emergency Drugs available, Suction available, Patient being monitored and Timeout performed Patient Re-evaluated:Patient Re-evaluated prior to inductionOxygen Delivery Method: Circle system utilized Preoxygenation: Pre-oxygenation with 100% oxygen Intubation Type: IV induction Ventilation: Mask ventilation without difficulty and Oral airway inserted - appropriate to patient size Laryngoscope Size: Mac and 3 (DL times one by myself and unable to view cords, DL times two by DR. Smith ) Grade View: Grade III Tube type: Oral Tube size: 7.5 mm Number of attempts: 3 Airway Equipment and Method: Bougie stylet Placement Confirmation: ETT inserted through vocal cords under direct vision,  positive ETCO2 and breath sounds checked- equal and bilateral Secured at: 21 cm Tube secured with: Tape Dental Injury: Injury to lip and Bloody posterior oropharynx

## 2014-10-19 NOTE — Discharge Instructions (Addendum)
LAPAROSCOPIC SURGERY: POST OP INSTRUCTIONS  1. DIET: Follow a light bland diet the first 24 hours after arrival home, such as soup, liquids, crackers, etc.  Be sure to include lots of fluids daily.  Avoid fast food or heavy meals as your are more likely to get nauseated.  Eat a low fat the next few days after surgery.   2. Take your usually prescribed home medications unless otherwise directed. 3. PAIN CONTROL: a. Pain is best controlled by a usual combination of three different methods TOGETHER: i. Ice/Heat ii. Over the counter pain medication iii. Prescription pain medication b. Most patients will experience some swelling and bruising around the incisions.  Ice packs or heating pads (30-60 minutes up to 6 times a day) will help. Use ice for the first few days to help decrease swelling and bruising, then switch to heat to help relax tight/sore spots and speed recovery.  Some people prefer to use ice alone, heat alone, alternating between ice & heat.  Experiment to what works for you.  Swelling and bruising can take several weeks to resolve.   c. It is helpful to take an over-the-counter pain medication regularly for the first few weeks.  Choose one of the following that works best for you: i. Naproxen (Aleve, etc)  Two  tabs twice a day ii. Ibuprofen (Advil, etc) Three  tabs four times a day (every meal & bedtime) iii. Acetaminophen (Tylenol, etc) 500-650mg  four times a day (every meal & bedtime) d. A  prescription for pain medication (such as oxycodone, hydrocodone, etc) should be given to you upon discharge.  Take your pain medication as prescribed.  i. If you are having problems/concerns with the prescription medicine (does not control pain, nausea, vomiting, rash, itching, etc), please call us [phone] to see if we need to switch you to a different pain medicine that will work better for you and/or control your side effect better. ii. If you need a refill on your pain  medication, please contact your pharmacy.  They will contact our office to request authorization. Prescriptions will not be filled after 5 pm or on week-ends. 4. Avoid getting constipated.  Between the surgery and the pain medications, it is common to experience some constipation.  Increasing fluid intake and taking a fiber supplement (such as Metamucil, Citrucel, FiberCon, MiraLax, etc) 1-2 times a day regularly will usually help prevent this problem from occurring.  A mild laxative (prune juice, Milk of Magnesia, MiraLax, etc) should be taken according to package directions if there are no bowel movements after 48 hours.   5. Watch out for diarrhea.  If you have many loose bowel movements, simplify your diet to bland foods & liquids for a few days.  Stop any stool softeners and decrease your fiber supplement.  Switching to mild anti-diarrheal medications (Kayopectate, Pepto Bismol) can help.  If this worsens or does not improve, please call us. 6. Wash / shower every day.  You may shower over the dressings as they are waterproof.  Continue to shower over incision(s) after the dressing is off. 7. Remove your waterproof bandages 5 days after surgery.  You may leave the incision open to air.  You may replace a dressing/Band-Aid to cover the incision for comfort if you wish.  8. ACTIVITIES as tolerated:   a. You may resume regular (light) daily activities beginning the next day--such as daily self-care, walking, climbing stairs--gradually increasing activities as tolerated.  If you can walk 30 minutes without difficulty, it  is safe to try more intense activity such as jogging, treadmill, bicycling, low-impact aerobics, swimming, etc. b. Save the most intensive and strenuous activity for last such as sit-ups, heavy lifting, contact sports, etc  Refrain from any heavy lifting or straining until you are off narcotics for pain control.   c. DO NOT PUSH THROUGH PAIN.  Let pain be your guide: If it hurts to do  something, don't do it.  Pain is your body warning you to avoid that activity for another week until the pain goes down. d. You may drive when you are no longer taking prescription pain medication, you can comfortably wear a seatbelt, and you can safely maneuver your car and apply brakes. e. Bonita Quin may have sexual intercourse when it is comfortable.  9. FOLLOW UP in our office a. Please call [facility] at [phone] to set up an appointment to see your surgeon in the office for a follow-up appointment approximately 2-3 weeks after your surgery. b. Make sure that you call for this appointment the day you arrive home to insure a convenient appointment time. 10. IF YOU HAVE DISABILITY OR FAMILY LEAVE FORMS, BRING THEM TO THE OFFICE FOR PROCESSING.  DO NOT GIVE THEM TO YOUR DOCTOR.   WHEN TO CALL us [phone]: 1. Poor pain control 2. Reactions / problems with new medications (rash/itching, nausea, etc)  3. Fever over 101.5 F (38.5 C) 4. Inability to urinate 5. Nausea and/or vomiting 6. Worsening swelling or bruising 7. Continued bleeding from incision. 8. Increased pain, redness, or drainage from the incision   The clinic staff is available to answer your questions during regular business hours (8:30am-5pm).  Please dont hesitate to call and ask to speak to one of our nurses for clinical concerns.   If you have a medical emergency, go to the nearest emergency room or call 911.  A surgeon from [facility] is always on call at the [facility], [place] [address], [place], [place]  [address] ? MAIN: [phone]) [phone] ? TOLL FREE: [phone] ?  FAX [phone] [email or web address]   Laparoscopic Cholecystectomy, Care After Refer to this sheet in the next few weeks. These instructions provide you with information on caring for yourself after your procedure. Your health care provider may also give you more specific instructions.  Your treatment has been planned according to current medical practices, but problems sometimes occur. Call your health care provider if you have any problems or questions after your procedure. WHAT TO EXPECT AFTER THE PROCEDURE After your procedure, it is typical to have the following:  Pain at your incision sites. You will be given pain medicines to control the pain.  Mild nausea or vomiting. This should improve after the first 24 hours.  Bloating and possibly shoulder pain from the gas used during the procedure. This will improve after the first 24 hours. HOME CARE INSTRUCTIONS   Change bandages (dressings) as directed by your health care provider.  Keep the wound dry and clean. You may wash the wound gently with soap and water. Gently blot or dab the area dry.  Do not take baths or use swimming pools or hot tubs for 2 weeks or until your health care provider approves.  Only take over-the-counter or prescription medicines as directed by your health care provider.  Continue your normal diet as directed by your health care provider.  Do not lift anything heavier than 10 pounds (4.5 kg) until your health  care provider approves.  Do not play contact sports for 1 week or until your health care provider approves. SEEK MEDICAL CARE IF:   You have redness, swelling, or increasing pain in the wound.  You notice yellowish-white fluid (pus) coming from the wound.  You have drainage from the wound that lasts longer than 1 day.  You notice a bad smell coming from the wound or dressing.  Your surgical cuts (incisions) break open. SEEK IMMEDIATE MEDICAL CARE IF:   You develop a rash.  You have difficulty breathing.  You have chest pain.  You have a fever.  You have increasing pain in the shoulders (shoulder strap areas).  You have dizzy episodes or faint while standing.  You have severe abdominal pain.  You feel sick to your stomach (nauseous) or throw up (vomit) and this lasts  for more than 1 day. Document Released: 03/16/2005 Document Revised: 01/04/2013 Document Reviewed: 10/26/2012 [facility] Patient Information 2015 [place], [place]. This information is not intended to replace advice given to you by your health care provider. Make sure you discuss any questions you have with your health care provider.  General Anesthesia, Care After Refer to this sheet in the next few weeks. These instructions provide you with information on caring for yourself after your procedure. Your health care provider may also give you more specific instructions. Your treatment has been planned according to current medical practices, but problems sometimes occur. Call your health care provider if you have any problems or questions after your procedure. WHAT TO EXPECT AFTER THE PROCEDURE After the procedure, it is typical to experience:  Sleepiness.  Nausea and vomiting. HOME CARE INSTRUCTIONS  For the first 24 hours after general anesthesia:  Have a responsible person with you.  Do not drive a car. If you are alone, do not take public transportation.  Do not drink alcohol.  Do not take medicine that has not been prescribed by your health care provider.  Do not sign important papers or make important decisions.  You may resume a normal diet and activities as directed by your health care provider.  Change bandages (dressings) as directed.  If you have questions or problems that seem related to general anesthesia, call the hospital and ask for the anesthetist or anesthesiologist on call. SEEK MEDICAL CARE IF:  You have nausea and vomiting that continue the day after anesthesia.  You develop a rash. SEEK IMMEDIATE MEDICAL CARE IF:   You have difficulty breathing.  You have chest pain.  You have any allergic problems. Document Released: 06/22/2000 Document Revised: 03/21/2013 Document Reviewed: 09/29/2012 [facility] Patient Information 2015 [place], [place]. This information is  not intended to replace advice given to you by your health care provider. Make sure you discuss any questions you have with your health care provider.

## 2014-10-20 DIAGNOSIS — K8010 Calculus of gallbladder with chronic cholecystitis without obstruction: Secondary | ICD-10-CM | POA: Diagnosis not present

## 2014-10-20 MED ORDER — IBUPROFEN 400 MG PO TABS: 800.0000 mg | ORAL_TABLET | Freq: Four times a day (QID) | ORAL | Status: DC | PRN

## 2014-10-20 MED ORDER — TRAMADOL HCL 50 MG PO TABS: 50.0000 mg | ORAL_TABLET | Freq: Four times a day (QID) | ORAL | Status: DC | PRN

## 2014-10-20 MED ORDER — ONDANSETRON 8 MG PO TBDP
8.0000 mg | ORAL_TABLET | Freq: Three times a day (TID) | ORAL | Status: DC | PRN
Start: 2014-10-20 — End: 2016-06-02

## 2014-10-20 NOTE — Progress Notes (Signed)
[name] to be D/C'd Home per MD order.  Discussed with the patient and all questions fully answered.  VSS, Surgical incision sites clean, dry, intact with no sign of infection.  IV catheter discontinued intact. Site without signs and symptoms of complications. Dressing and pressure applied.  An After Visit Summary was printed and given to the patient. Patient received prescription.  D/c education completed with patient/family including follow up instructions, medication list, d/c activities limitations if indicated, with other d/c instructions as indicated by MD - patient able to verbalize understanding, all questions fully answered.   Patient instructed to return to ED, call 911, or call MD for any changes in condition.   Patient escorted via WC, and D/C home via private auto.  Burt Ek 10/20/2014 9:23 AM

## 2014-10-20 NOTE — Discharge Summary (Signed)
Physician Discharge Summary  Patient ID: CYRAH MCLAMB MRN: [id number] DOB/AGE: 1955/06/29 59 y.o.  Admit date: 10/19/2014 Discharge date: 10/20/2014  Admitting Diagnosis: Post operative vomtiing  Discharge Diagnosis Patient Active Problem List   Diagnosis Date Noted  . Status post laparoscopic cholecystectomy 10/19/2014  . Gallstones 10/19/2014    Consultants none  Imaging: Chest 2 View  10/19/2014   CLINICAL DATA:  Preoperative chest radiograph for gallbladder surgery. Initial encounter.  EXAM: CHEST  2 VIEW  COMPARISON:  Chest radiograph performed 04/22/2013  FINDINGS: The lungs are well-aerated. Minimal bibasilar atelectasis is noted. Peribronchial thickening is seen. There is no evidence of pleural effusion or pneumothorax.  The heart is normal in size; the mediastinal contour is within normal limits. No acute osseous abnormalities are seen.  IMPRESSION: Minimal bibasilar atelectasis noted. Peribronchial thickening seen. Lungs otherwise clear.   Electronically Signed   By: Roanna Raider M.D.   On: 10/19/2014 06:34    Procedures Laparoscopic cholecystectomy   Hospital Course:  Navah had a cholecystectomy.  Developed vomiting in the PACU and was admitted for IV antiemetics and IVF.  Diet was advanced as tolerated.  On POD#1, the patient was voiding well, nausea had improved, tolerating diet, ambulating well, pain well controlled, vital signs stable, incisions c/d/i and felt stable for discharge home. We discussed medication side effects and ways to minimize side effects.  She verbalizes understanding.  She will try aleve, then tramadol and last resort percocet but take zofran beforehand as she has n/v.  I suspect she will do quite well with NSAIDs and tramadol.   Patient will follow up in our office in 2 weeks and knows to call with questions or concerns.  Physical Exam: General:  Alert, NAD, pleasant, comfortable Abd:  Soft, ND, mild tenderness, incisions C/D/I     Medication List    STOP taking these medications        ondansetron 4 MG tablet  Commonly known as:  ZOFRAN     oxyCODONE-acetaminophen 5-325 MG per tablet  Commonly known as:  PERCOCET      TAKE these medications        AXERT 12.5 MG tablet  Generic drug:  almotriptan  Take 12.5 mg by mouth as needed for migraine. may repeat in 2 hours if needed     FLAX SEEDS PO  Take 2 tablets by mouth daily.     ibuprofen 400 MG tablet  Commonly known as:  ADVIL,MOTRIN  Take 2 tablets (800 mg total) by mouth every 6 (six) hours as needed (pain).     multivitamin with minerals Tabs tablet  Take 1 tablet by mouth daily.     ondansetron 8 MG disintegrating tablet  Commonly known as:  ZOFRAN ODT  Take 1 tablet (8 mg total) by mouth every 8 (eight) hours as needed for nausea or vomiting.     traMADol 50 MG tablet  Commonly known as:  ULTRAM  Take 1 tablet (50 mg total) by mouth every 6 (six) hours as needed.     VITAMIN B-12 CR PO  Take 1 tablet by mouth daily.             Follow-up Information    Follow up with Jimmye Norman, MD. Schedule an appointment as soon as possible for a visit in 2 weeks.   Specialty:  General Surgery   Contact information:   [address] [place] [address] [phone]       Signed: Ashok Norris,  [facility] [phone]  10/20/2014, 8:59 AM

## 2014-10-22 ENCOUNTER — Encounter (HOSPITAL_COMMUNITY): Payer: Self-pay | Admitting: General Surgery

## 2015-01-20 IMAGING — CR DG FOOT COMPLETE 3+V*R*
3 series · 3 of 3 positions shown · non-contrast
Comparison: None.

CLINICAL DATA: Injured right foot 4 days ago with metatarsal pain

EXAM:
RIGHT FOOT COMPLETE - 3+ VIEW

[view not recorded (1 of 3)]
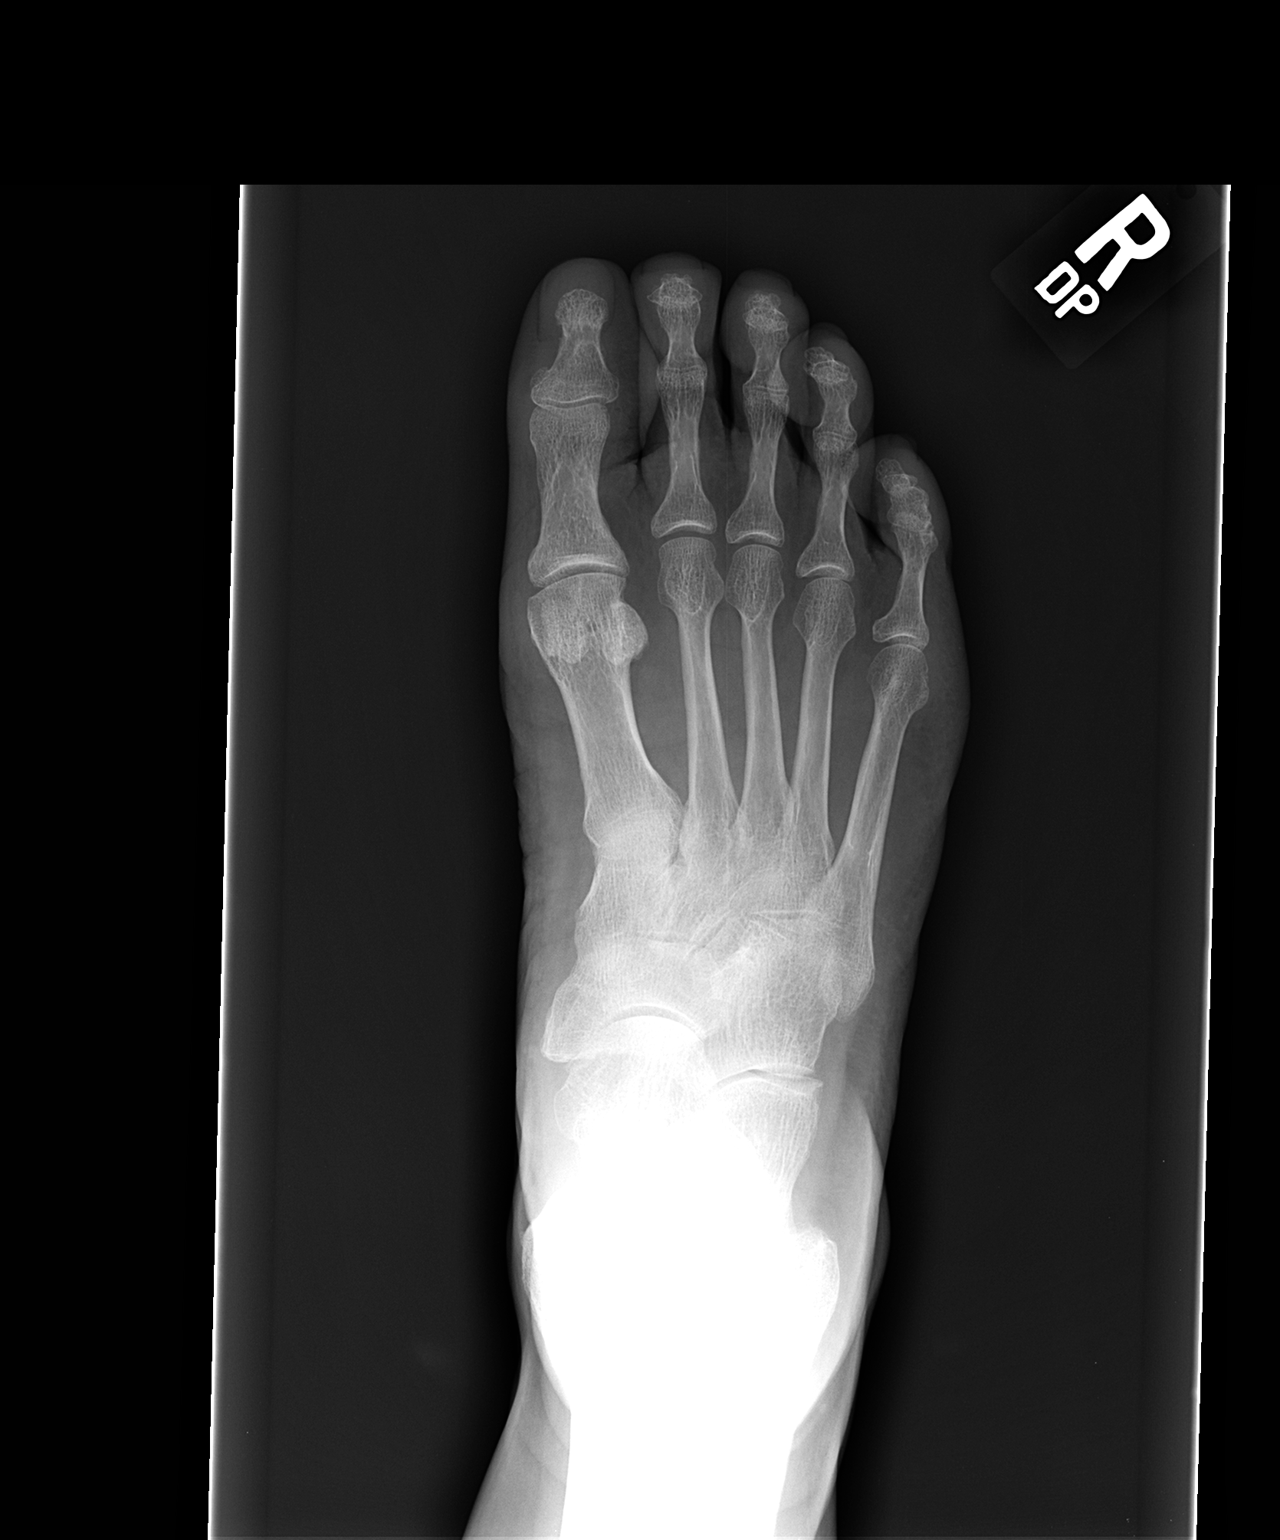

[view not recorded (2 of 3)]
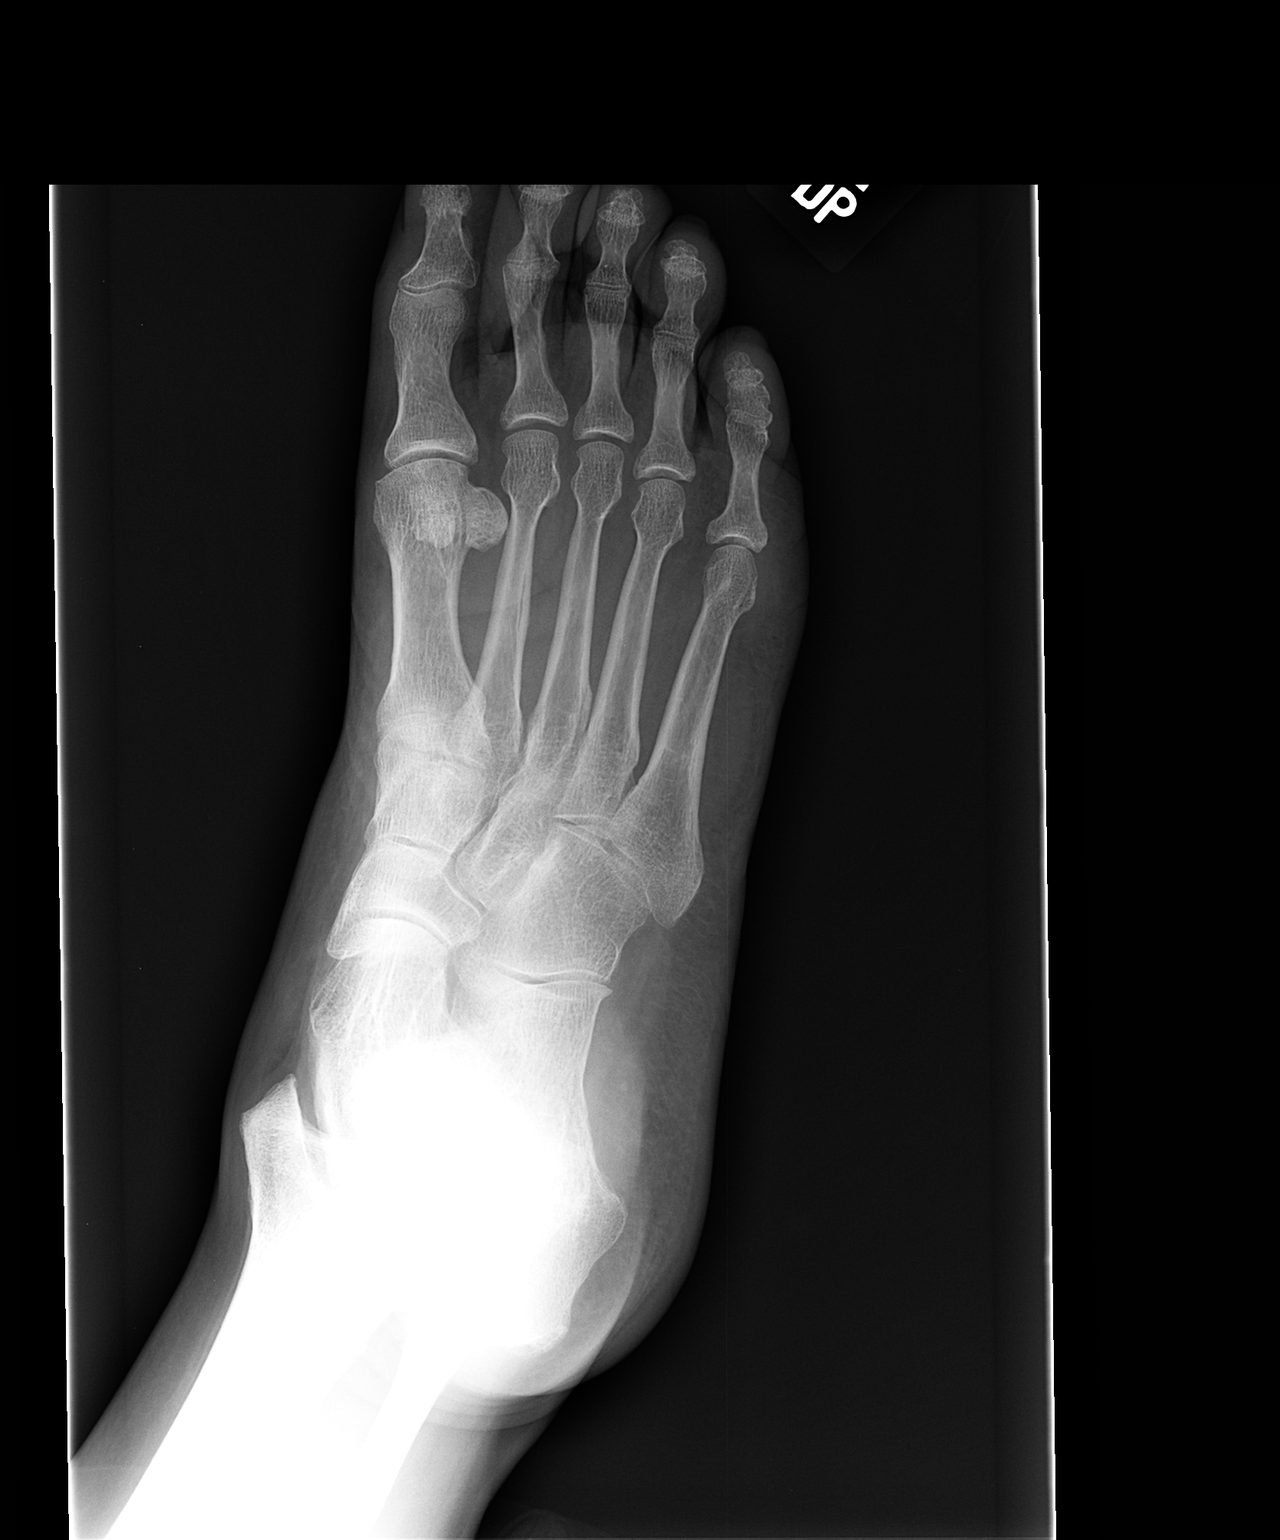

[view not recorded (3 of 3)]
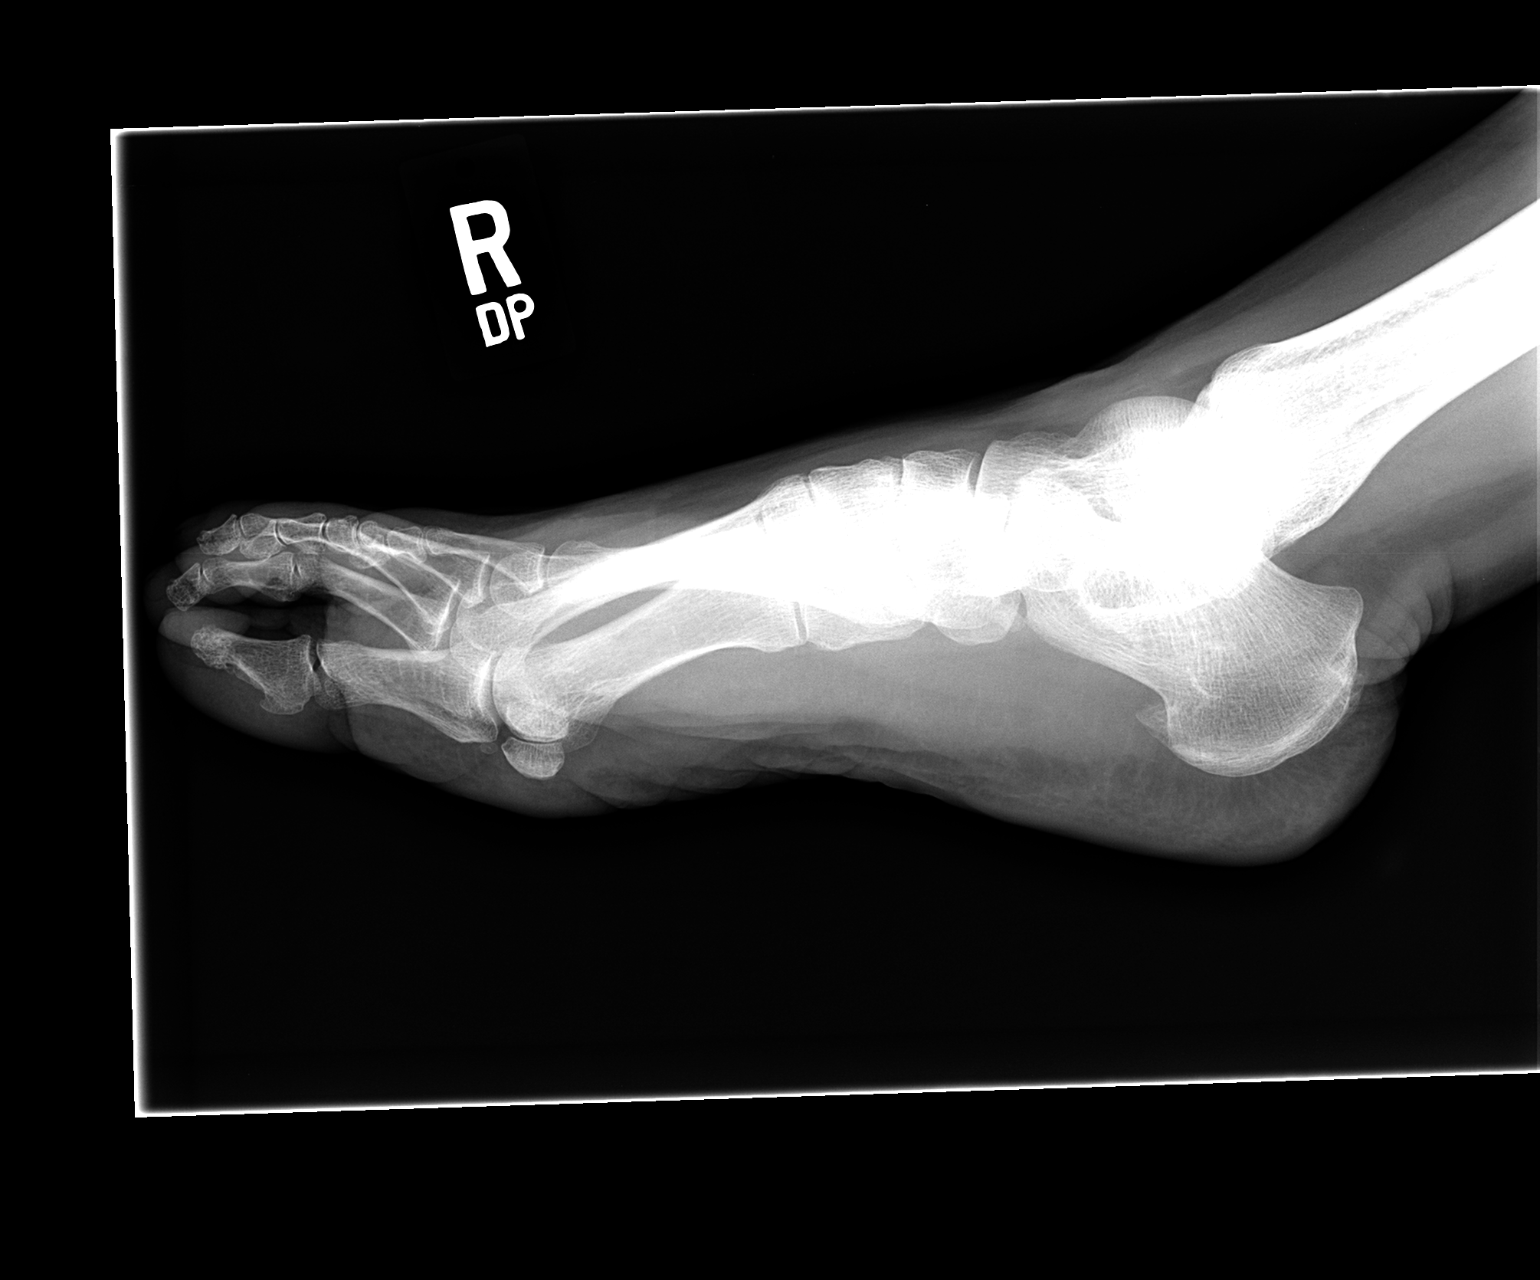

[3 of 3 positions shown; findings below may reference images not displayed]

FINDINGS: Tarsal-metatarsal alignment is normal. No acute fracture is seen.
Joint spaces appear normal. A small plantar calcaneal degenerative
spur is noted.
IMPRESSION: No acute abnormality.

## 2016-06-02 ENCOUNTER — Encounter (INDEPENDENT_AMBULATORY_CARE_PROVIDER_SITE_OTHER): Payer: Self-pay

## 2016-06-02 ENCOUNTER — Encounter: Payer: Self-pay | Admitting: Allergy and Immunology

## 2016-06-02 ENCOUNTER — Ambulatory Visit (INDEPENDENT_AMBULATORY_CARE_PROVIDER_SITE_OTHER): Payer: Self-pay | Admitting: Allergy and Immunology

## 2016-06-02 VITALS — BP 142/86 | HR 88 | Temp 98.5°F | Resp 18 | Ht 61.0 in | Wt 176.0 lb

## 2016-06-02 DIAGNOSIS — J339 Nasal polyp, unspecified: Secondary | ICD-10-CM

## 2016-06-02 DIAGNOSIS — J3089 Other allergic rhinitis: Secondary | ICD-10-CM | POA: Diagnosis not present

## 2016-06-02 LAB — PULMONARY FUNCTION TEST

## 2016-06-02 MED ORDER — OLOPATADINE HCL 0.7 % OP SOLN: 1.0000 [drp] | Freq: Every day | OPHTHALMIC | 5 refills | Status: DC | PRN

## 2016-06-02 MED ORDER — MONTELUKAST SODIUM 10 MG PO TABS: 10.0000 mg | ORAL_TABLET | Freq: Every day | ORAL | 5 refills | Status: DC

## 2016-06-02 MED ORDER — BECLOMETHASONE DIPROPIONATE 80 MCG/ACT NA AERS: INHALATION_SPRAY | NASAL | 5 refills | Status: DC

## 2016-06-02 MED ORDER — METHYLPREDNISOLONE ACETATE 80 MG/ML IJ SUSP
80.0000 mg | Freq: Once | INTRAMUSCULAR | Status: AC
  Administered 2016-06-02: 80 mg via INTRAMUSCULAR

## 2016-06-02 NOTE — Progress Notes (Signed)
Dear Dr. Haroldine Laws,  Thank you for referring Jacqueline Fischer to the [facility] [place] on 06/02/2016.   Below is a summation of this patient's evaluation and recommendations.  Thank you for your referral. I will keep you informed about this patient's response to treatment.   If you have any questions please do not hesitate to contact me.   Sincerely,  Jessica Priest, MD Allergy / Immunology [facility]   ______________________________________________________________________    NEW PATIENT NOTE  Referring Provider: Keturah Barre, MD Primary Provider: Lillia Mountain, MD Date of office visit: 06/02/2016    Subjective:   Chief Complaint:  Jacqueline Fischer (DOB: 08/21/1955) is a 61 y.o. female who presents to the clinic on 06/02/2016 with a chief complaint of Nasal Polyps .     HPI: Jacqueline Fischer presents to this clinic in evaluation of respiratory tract problems that have been a long-standing issue of many years duration.  Jacqueline Fischer has a history of nasal polyposis requiring a polypectomy in January 2016 which did help her rather persistent right-sided nasal congestion. However, within one year she redeveloped right nasal congestion and she once again had nasal polyps return. She has a history of sneezing and nasal congestion and itchy red watery eyes especially from April through June of the year especially with exposure to outdoors and maybe with exposure to cats and dogs. She has tried nasal steroids in the past but is intolerant of using these medications because they trigger migraines.  Apparently Jacqueline Fischer has sinusitis 4 times per year requiring the administration of an antibiotic 4 times per year. She does not have chronic ugly nasal discharge and does not have any anosmia.  She does have a history of migraine occurring about 3 times per month that is triggered off by consumption of onions,  processed meat consumption, and anxiety. She will take an Axert which works quite well and will relieve eating her headache.  Finally, Jacqueline Fischer does have a history of cough. This has been a long-standing issue of many years. She has an intermittent cough associated with some throat clearing and a tickle in her throat on occasion. She does not have a history of raspy voice. She does not have a history of reflux. She denies any regurgitation. She does drink Pepsi 4 times per week and has tea 4 times per week and does not consume any chocolate or alcohol. She does not have a history of exercise-induced coughing or cold air induced coughing.  Past Medical History:  Diagnosis Date  . Anemia    Years ago  . Arthritis   . Colitis    1970's  . Complication of anesthesia    pt states during nasal surgery she woke up during procedure  . History of hiatal hernia   . Migraine   . Pneumonia   . PONV (postoperative nausea and vomiting)     Past Surgical History:  Procedure Laterality Date  . CHOLECYSTECTOMY  10/19/2014   laproscopic  . CHOLECYSTECTOMY N/A 10/19/2014   Procedure: LAPAROSCOPIC CHOLECYSTECTOMY;  Surgeon: Jimmye Norman, MD;  Location: [facility] OR;  Service: General;  Laterality: N/A;  . COLONOSCOPY    . NASAL POLYP SURGERY    . TUBAL LIGATION      Allergies as of 06/02/2016      Reactions   Hydrocodone Nausea And Vomiting   Oxycodone Nausea And Vomiting   Tetracyclines & Related Rash, Other (See  Comments)   Childhood allergy      Medication List      AXERT 12.5 MG tablet Generic drug:  almotriptan Take 12.5 mg by mouth as needed for migraine. may repeat in 2 hours if needed   COLD & SINUS RELIEF PO Take by mouth.   FLAX SEEDS PO Take 2 tablets by mouth daily.   VITAMIN C PO Take by mouth.   VITAMIN D PO Take by mouth.       Review of systems negative except as noted in HPI / PMHx or noted below:  Review of Systems  Constitutional: Negative.   HENT: Negative.   Eyes:  Negative.   Respiratory: Negative.   Cardiovascular: Negative.   Gastrointestinal: Negative.   Genitourinary: Negative.   Musculoskeletal: Negative.   Skin: Negative.   Neurological: Negative.   Endo/Heme/Allergies: Negative.   Psychiatric/Behavioral: Negative.     Family History  Problem Relation Age of Onset  . Hypertension Mother   . Diabetes type II Mother   . Renal Disease Mother   . Dementia Father   . Congestive Heart Failure Father   . Stroke Father   . Hypertension Father   . Renal Disease Brother   . Diabetes type II Brother     Social History   Social History  . Marital status: Divorced    Spouse name: N/A  . Number of children: N/A  . Years of education: N/A   Occupational History  . Not on file.   Social History Main Topics  . Smoking status: Former Smoker    Types: Cigarettes    Quit date: 10/17/1973  . Smokeless tobacco: Never Used  . Alcohol use No  . Drug use: No  . Sexual activity: Not on file   Other Topics Concern  . Not on file   Social History Narrative  . No narrative on file    Environmental and Social history  Lives in a townhouse with a dry environment, no animals located inside the household, carpeting the bedroom, no plastic on the bed or pillow, and no smokers located inside the household. She works as a Tour manager as Clinical biochemist.  Objective:   Vitals:   06/02/16 0826  BP: (!) 142/86  Pulse: 88  Resp: 18  Temp: 98.5 F (36.9 C)   Height: 5\' 1"  (154.9 cm) Weight: 176 lb (79.8 kg)  Physical Exam  Constitutional: She is well-developed, well-nourished, and in no distress.  Slight throat clearing  HENT:  Head: Normocephalic. Head is without right periorbital erythema and without left periorbital erythema.  Right Ear: Tympanic membrane, external ear and ear canal normal.  Left Ear: Tympanic membrane, external ear and ear canal normal.  Nose: Mucosal edema (right nasal polyp) present. No rhinorrhea.    Mouth/Throat: Oropharynx is clear and moist and mucous membranes are normal. No oropharyngeal exudate.  Eyes: Conjunctivae and lids are normal. Pupils are equal, round, and reactive to light.  Neck: Trachea normal. No tracheal deviation present. No thyromegaly present.  Cardiovascular: Normal rate, regular rhythm, S1 normal, S2 normal and normal heart sounds.   No murmur heard. Pulmonary/Chest: Effort normal. No stridor. No tachypnea. No respiratory distress. She has no wheezes. She has no rales. She exhibits no tenderness.  Abdominal: Soft. She exhibits no distension and no mass. There is no hepatosplenomegaly. There is no tenderness. There is no rebound and no guarding.  Musculoskeletal: She exhibits no edema or tenderness.  Lymphadenopathy:       Head (right  side): No tonsillar adenopathy present.       Head (left side): No tonsillar adenopathy present.    She has no cervical adenopathy.    She has no axillary adenopathy.  Neurological: She is alert. Gait normal.  Skin: No rash noted. She is not diaphoretic. No erythema. No pallor. Nails show no clubbing.  Psychiatric: Mood and affect normal.    Diagnostics: Allergy skin tests were performed. She demonstrated hypersensitivity against grasses, weeds, trees, molds, and house dust mite.  Spirometry was performed and demonstrated an FEV1 of 1.71 @ 94 % of predicted.  Assessment and Plan:    1. Other allergic rhinitis   2. Nasal polyposis     1. Allergen avoidance measures  2. Treat and prevent inflammation:   A. Qnasl 80 - 1-2 puffs each nostril one time per day  B. montelukast 10 mg - one tablet one time per day  C. Depo-Medrol 80 IM delivered in clinic today  3. If needed:   A. nasal saline wash  B. OTC antihistamine - Claritin/Allegra/Zyrtec  C. Pazeo - one drop each eye one time per day  4. Cough? Reflux? - Consolidate caffeine as much as possible  5. Immunotherapy?  6. Return to clinic in 4 weeks or earlier if  problem  Jacqueline Fischer appears to have significant inflammation affecting her upper airway along with nasal polyposis with a atopic trigger. We will get her to perform allergen avoidance measures as best as possible and I given her a combination of a nasal steroid and a leukotriene modifier and also administered a systemic steroid today. Hopefully her nasal steroid will not precipitate a migraine as older nasal steroids having the past. She also appears to have a cough and some symptoms suggesting that she may have LPR and I will treat her very conservatively for this issue by just having her consolidate her caffeine as much as possible. Consolidating her caffeine should also help with her migraine headaches. She would definitely be a candidate for immunotherapy if she fails medical treatment. I will regroup with her in 4 weeks or earlier if there is a problem.  Jessica PriestEric J. Kozlow, MD  [facility]

## 2016-06-02 NOTE — Patient Instructions (Addendum)
  1. Allergen avoidance measures  2. Treat and prevent inflammation:   A. Qnasl 80 - 1-2 puffs each nostril one time per day  B. montelukast 10 mg - one tablet one time per day  C. Depo-Medrol 80 IM delivered in clinic today  3. If needed:   A. nasal saline wash  B. OTC antihistamine - Claritin/Allegra/Zyrtec  C. Pazeo - one drop each eye one time per day  4. Cough? Reflux? - Consolidate caffeine as much as possible  5. Immunotherapy?  6. Return to clinic in 4 weeks or earlier if problem

## 2016-06-04 ENCOUNTER — Other Ambulatory Visit: Payer: Self-pay | Admitting: *Deleted

## 2016-06-04 MED ORDER — OLOPATADINE HCL 0.1 % OP SOLN: 1.0000 [drp] | Freq: Two times a day (BID) | OPHTHALMIC | 12 refills | Status: DC

## 2016-06-21 IMAGING — US US ABDOMEN COMPLETE
1 series · 13 of 25 positions shown · non-contrast
Comparison: Abdominal CT scan September 30, 2014

CLINICAL DATA: Eight days of abdominal and back pain associated
with nausea

EXAM:
ULTRASOUND ABDOMEN COMPLETE

[Series 1: us abdomen complete · 0.20mm/px · 13 of 99 slices shown]
[im 1/99]
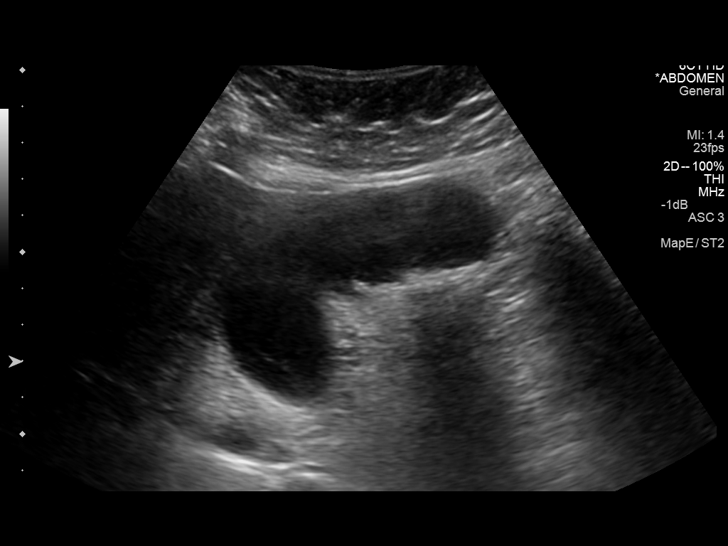
[im 9/99]
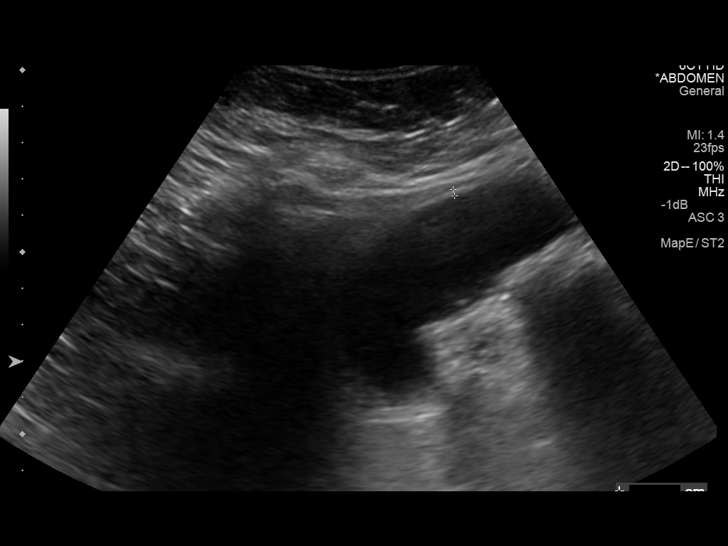
[im 17/99]
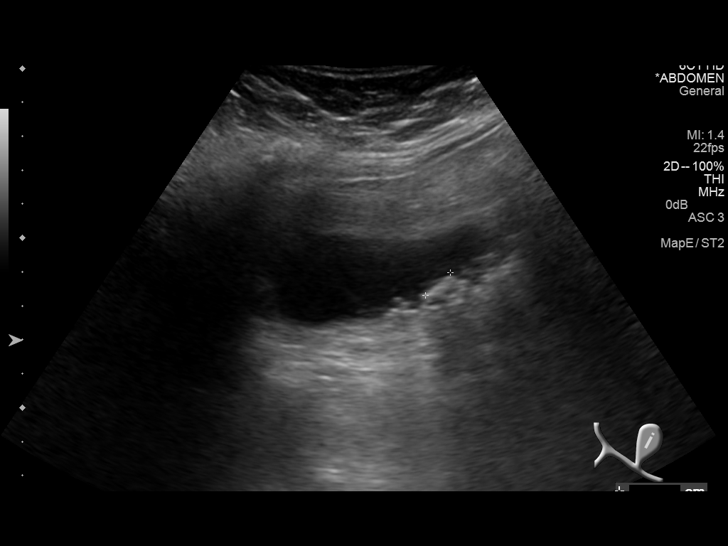
[im 25/99]
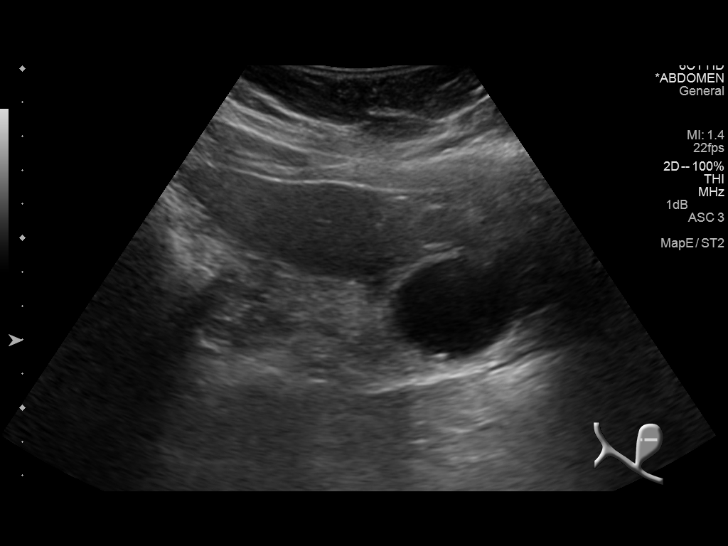
[im 33/99]
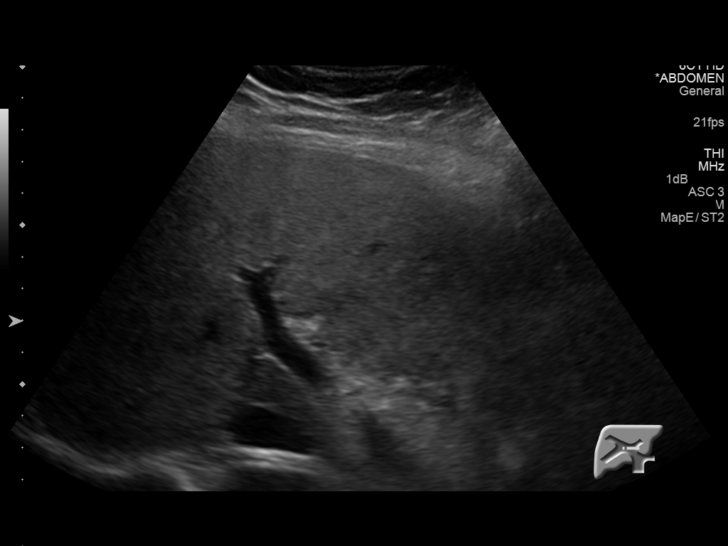
[im 41/99]
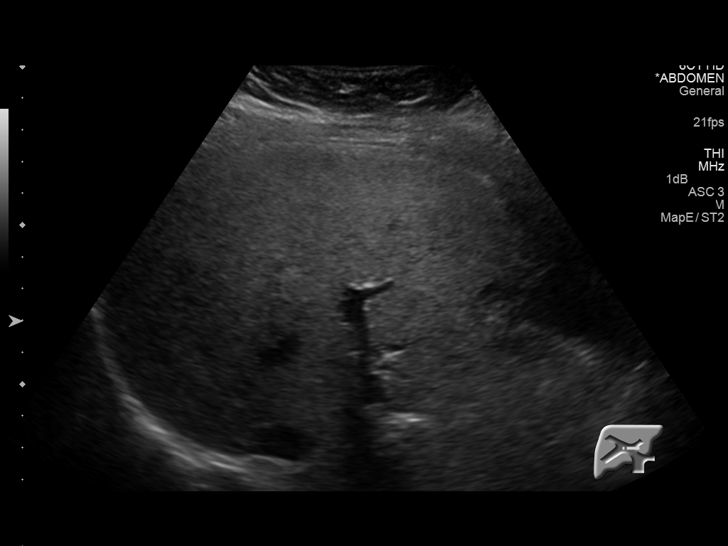
[im 50/99]
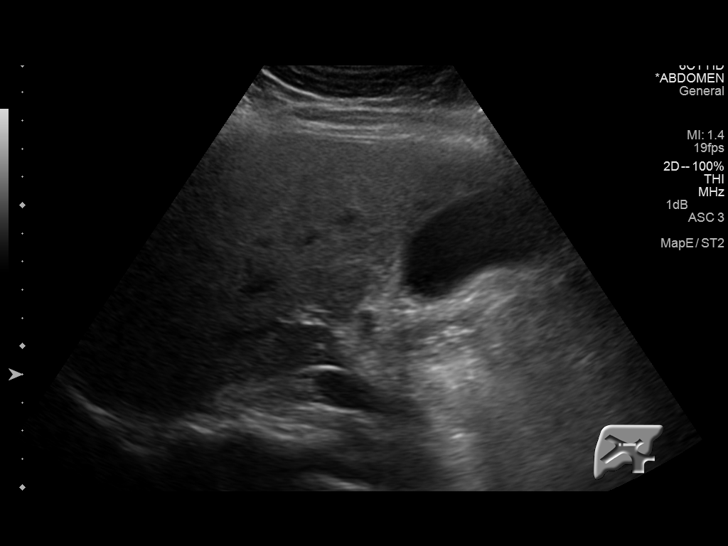
[im 58/99]
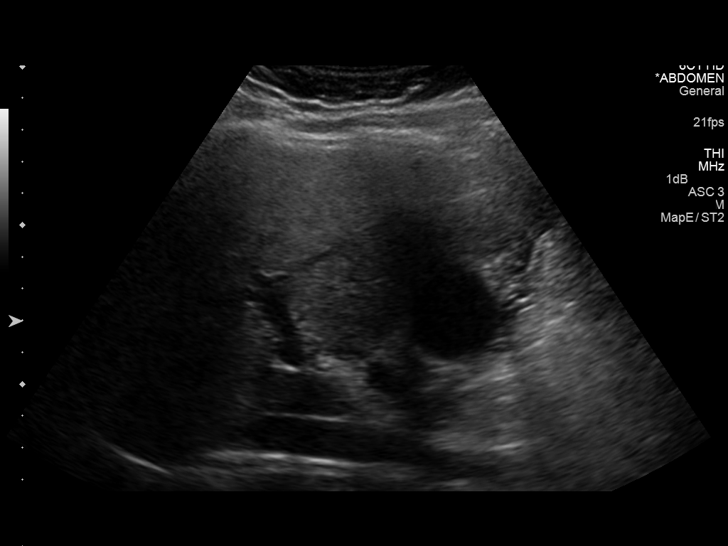
[im 66/99]
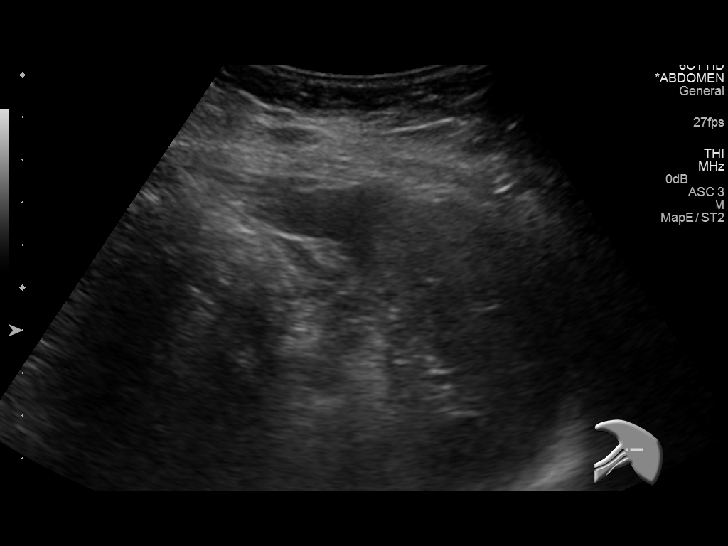
[im 74/99]
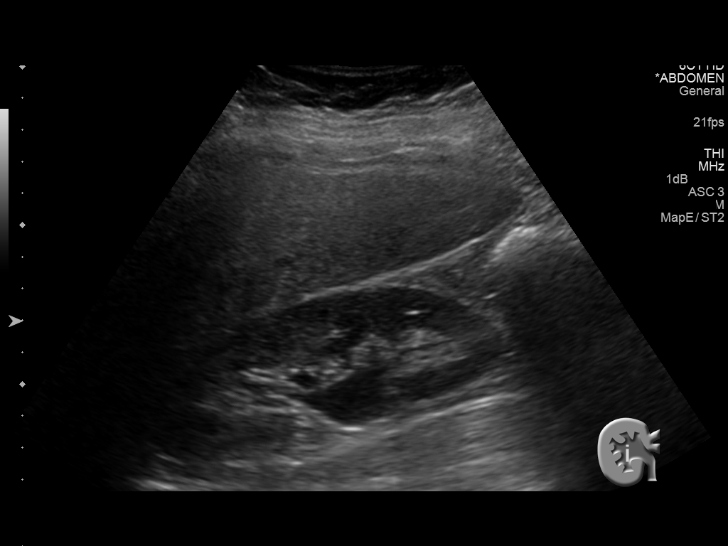
[im 82/99]
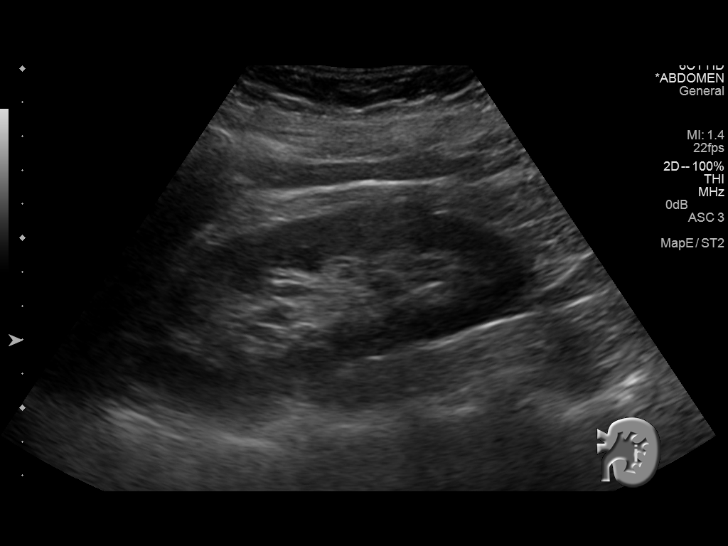
[im 90/99]
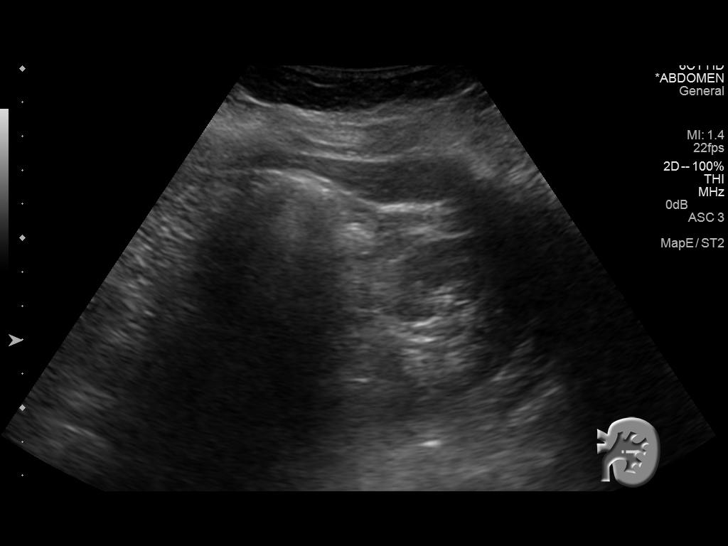
[im 99/99]
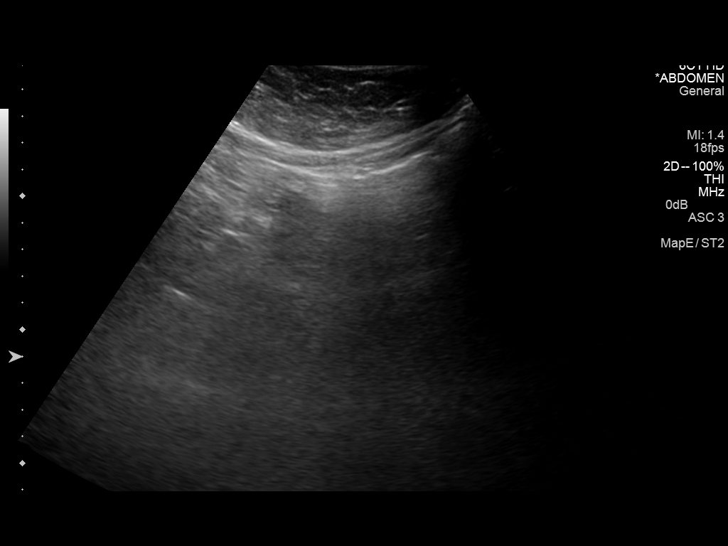

[13 of 25 positions shown; findings below may reference images not displayed]

FINDINGS: Gallbladder: The gallbladder is adequately distended. There are
echogenic mobile shadowing stones. There is no gallbladder wall
thickening or pericholecystic fluid.

Common bile duct: Diameter: 3.6 mm

Liver: No focal lesion identified. Within normal limits in
parenchymal echogenicity.

IVC: No abnormality visualized.

Pancreas: Visualized portion unremarkable.

Spleen: Size and appearance within normal limits.

Right Kidney: Length: 11.8 cm. There is mild hydronephrosis on the
right. The visualized portions of the right ureter are dilated.

Left Kidney: Length: 12.2 cm. Echogenicity within normal limits. No
mass or hydronephrosis visualized.

Abdominal aorta: The distal aorta was obscured by bowel gas. The
proximal and mid aorta do not exceed 2.3 cm in diameter.

Other findings: There is no free pelvic fluid.
IMPRESSION: 1. Gallstones without sonographic evidence of acute cholecystitis.
2. Mild right-sided hydronephrosis and proximal hydroureter. The
site of obstruction is not demonstrated on today's study.
Correlation with the presence or absence of hematuria is needed.

## 2016-06-30 ENCOUNTER — Ambulatory Visit (INDEPENDENT_AMBULATORY_CARE_PROVIDER_SITE_OTHER): Payer: BLUE CROSS/BLUE SHIELD | Admitting: Allergy and Immunology

## 2016-06-30 ENCOUNTER — Encounter: Payer: Self-pay | Admitting: Allergy and Immunology

## 2016-06-30 VITALS — BP 158/74 | HR 88 | Resp 20

## 2016-06-30 DIAGNOSIS — J339 Nasal polyp, unspecified: Secondary | ICD-10-CM

## 2016-06-30 DIAGNOSIS — J3089 Other allergic rhinitis: Secondary | ICD-10-CM

## 2016-06-30 NOTE — Patient Instructions (Signed)
  1. Continue to perform Allergen avoidance measures  2. Continue to Treat and prevent inflammation:   A. Qnasl 80 - 1-2 puffs each nostril one time per day  B. montelukast 10 mg - one tablet one time per day  3. If needed:   A. nasal saline wash  B. OTC antihistamine - Claritin/Allegra/Zyrtec  C. Pazeo - one drop each eye one time per day  6. Return to clinic in 6 months or earlier if problem

## 2016-06-30 NOTE — Progress Notes (Signed)
Follow-up Note  Referring Provider: Kirby Funk, MD Primary Provider: Lillia Mountain, MD Date of Office Visit: 06/30/2016  Subjective:   Jacqueline Fischer (DOB: 03/06/1956) is a 61 y.o. female [facility] in re-evaluation of the following:  HPI: Phyllis presents to this clinic in reevaluation of her allergic rhinitis and nasal polyposis addressed during her initial evaluation of 06/02/2016 at which point in time she started a nasal steroid and a leukotriene modifier and performed allergen avoidance measures and was treated with a single dose of systemic steroid.  She is much better. Her nasal airway is open and she does not have facial pressure and she has very little nasal discharge and she has resolved her cough. She has consolidate her caffeine to one time per week. She has performed house dust avoidance measures.  Allergies as of 06/30/2016      Reactions   Hydrocodone Nausea And Vomiting   Oxycodone Nausea And Vomiting   Tetracyclines & Related Rash, Other (See Comments)   Childhood allergy      Medication List      AXERT 12.5 MG tablet Generic drug:  almotriptan Take 12.5 mg by mouth as needed for migraine. may repeat in 2 hours if needed   Beclomethasone Dipropionate 80 MCG/ACT Aers Commonly known as:  QNASL Use one to two puffs in each nostril once daily.   COLD & SINUS RELIEF PO Take by mouth.   FLAX SEEDS PO Take 2 tablets by mouth daily.   montelukast 10 MG tablet Commonly known as:  SINGULAIR Take 1 tablet (10 mg total) by mouth at bedtime.   olopatadine 0.1 % ophthalmic solution Commonly known as:  PATANOL Place 1 drop into both eyes 2 (two) times daily.   VITAMIN C PO Take by mouth.   VITAMIN D PO Take by mouth.       Past Medical History:  Diagnosis Date  . Anemia    Years ago  . Arthritis   . Colitis    1970's  . Complication of anesthesia    pt states during nasal surgery she woke  up during procedure  . History of hiatal hernia   . Migraine   . Pneumonia   . PONV (postoperative nausea and vomiting)     Past Surgical History:  Procedure Laterality Date  . CHOLECYSTECTOMY  10/19/2014   laproscopic  . CHOLECYSTECTOMY N/A 10/19/2014   Procedure: LAPAROSCOPIC CHOLECYSTECTOMY;  Surgeon: Jimmye Norman, MD;  Location: [facility] OR;  Service: General;  Laterality: N/A;  . COLONOSCOPY    . NASAL POLYP SURGERY    . TUBAL LIGATION      Review of systems negative except as noted in HPI / PMHx or noted below:  Review of Systems  Constitutional: Negative.   HENT: Negative.   Eyes: Negative.   Respiratory: Negative.   Cardiovascular: Negative.   Gastrointestinal: Negative.   Genitourinary: Negative.   Musculoskeletal: Negative.   Skin: Negative.   Neurological: Negative.   Endo/Heme/Allergies: Negative.   Psychiatric/Behavioral: Negative.      Objective:   Vitals:   06/30/16 1620  BP: (!) 158/74  Pulse: 88  Resp: 20          Physical Exam  Constitutional: She is well-developed, well-nourished, and in no distress.  HENT:  Head: Normocephalic.  Right Ear: Tympanic membrane, external ear and ear canal normal.  Left Ear: Tympanic membrane, external ear and ear canal normal.  Nose: Mucosal edema (  minimal right-sided nasal polyposis less than 20% original size) present. No rhinorrhea.  Mouth/Throat: Uvula is midline, oropharynx is clear and moist and mucous membranes are normal. No oropharyngeal exudate.  Eyes: Conjunctivae are normal.  Neck: Trachea normal. No tracheal tenderness present. No tracheal deviation present. No thyromegaly present.  Cardiovascular: Normal rate, regular rhythm, S1 normal, S2 normal and normal heart sounds.   No murmur heard. Pulmonary/Chest: Breath sounds normal. No stridor. No respiratory distress. She has no wheezes. She has no rales.  Musculoskeletal: She exhibits no edema.  Lymphadenopathy:       Head (right side): No tonsillar  adenopathy present.       Head (left side): No tonsillar adenopathy present.    She has no cervical adenopathy.  Neurological: She is alert. Gait normal.  Skin: No rash noted. She is not diaphoretic. No erythema. Nails show no clubbing.  Psychiatric: Mood and affect normal.    Diagnostics: none   Assessment and Plan:   1. Other allergic rhinitis   2. Nasal polyposis     1. Continue to perform Allergen avoidance measures  2. Continue to Treat and prevent inflammation:   A. Qnasl 80 - 1-2 puffs each nostril one time per day  B. montelukast 10 mg - one tablet one time per day  3. If needed:   A. nasal saline wash  B. OTC antihistamine - Claritin/Allegra/Zyrtec  C. Pazeo - one drop each eye one time per day  6. Return to clinic in 6 months or earlier if problem  Zaina has had an excellent response to her medical therapy and she'll continue to perform allergen avoidance measures and continue on anti-inflammatory medications for her upper respiratory tract as noted above. I will see her back in this clinic in 6 months or earlier if there is a problem.  Laurette Schimke, MD Allergy / Immunology [facility]

## 2016-10-30 ENCOUNTER — Telehealth: Payer: Self-pay | Admitting: Allergy and Immunology

## 2016-10-30 NOTE — Telephone Encounter (Signed)
Pt came by to leave a message for dr Lucie Leatherkozlow nurse that she is having trouble with the singulair and wanted to know what to do it is giving her diarrhea. [phone]

## 2016-10-30 NOTE — Telephone Encounter (Signed)
Called patient. Patient informed me that she is taking Montelukast at night. If she eats after she takes it, then 40 minutes later she is having stomach pains and loose stool. So she stopped the montelukast for 5 days and her pains were gone and her stools are back to normal. Patient wanted to know what could she do? Take a lower dosage? Please advise

## 2016-11-02 NOTE — Telephone Encounter (Signed)
Called patient.left message in regards to cutting the Montelukast in half. To take a half in the morning,and a half in the evening.If she has any more questions to give us a call.

## 2016-11-02 NOTE — Telephone Encounter (Signed)
Please have patient cut tablet in half and try 1/2 tablet two times per day.

## 2016-12-15 ENCOUNTER — Other Ambulatory Visit: Payer: Self-pay | Admitting: Internal Medicine

## 2016-12-15 DIAGNOSIS — R1013 Epigastric pain: Secondary | ICD-10-CM

## 2016-12-17 ENCOUNTER — Ambulatory Visit
Admission: RE | Admit: 2016-12-17 | Discharge: 2016-12-17 | Disposition: A | Discharge status: Home / Self Care | Payer: BLUE CROSS/BLUE SHIELD | Source: Ambulatory Visit | Attending: Internal Medicine | Admitting: Internal Medicine

## 2016-12-17 DIAGNOSIS — R1013 Epigastric pain: Secondary | ICD-10-CM

## 2016-12-17 MED ORDER — IOPAMIDOL (ISOVUE-300) INJECTION 61%: 100.0000 mL | Freq: Once | INTRAVENOUS | Status: DC | PRN

## 2017-04-08 ENCOUNTER — Ambulatory Visit: Payer: BLUE CROSS/BLUE SHIELD | Admitting: Family Medicine

## 2017-04-08 ENCOUNTER — Encounter: Payer: Self-pay | Admitting: Family Medicine

## 2017-04-08 VITALS — BP 130/86 | HR 102 | Ht 62.0 in | Wt 179.6 lb

## 2017-04-08 DIAGNOSIS — J302 Other seasonal allergic rhinitis: Secondary | ICD-10-CM | POA: Diagnosis not present

## 2017-04-08 DIAGNOSIS — J339 Nasal polyp, unspecified: Secondary | ICD-10-CM | POA: Diagnosis not present

## 2017-04-08 DIAGNOSIS — J3089 Other allergic rhinitis: Secondary | ICD-10-CM | POA: Diagnosis not present

## 2017-04-08 DIAGNOSIS — R05 Cough: Secondary | ICD-10-CM | POA: Insufficient documentation

## 2017-04-08 DIAGNOSIS — J338 Other polyp of sinus: Secondary | ICD-10-CM | POA: Insufficient documentation

## 2017-04-08 DIAGNOSIS — R059 Cough, unspecified: Secondary | ICD-10-CM | POA: Insufficient documentation

## 2017-04-08 MED ORDER — BECLOMETHASONE DIPROPIONATE 80 MCG/ACT NA AERS: 80.0000 ug | INHALATION_SPRAY | Freq: Every day | NASAL | 3 refills | Status: DC

## 2017-04-08 NOTE — Patient Instructions (Addendum)
1. Allergic rhinitis - Continue allergen avoidance measures - Continue QNASAL 1-2 sprays in each nostril once a day for a stuffy nose - Over the counter antihistamines as needed for a runny nose - Mucinex [phone] mg twice a day as needed for thick post nasal drip (guaifenisen)  - Increase hydration as tolerated to thin out nasal secretions - Continue montelukast 10 mg once a day  Follow up in 6 months or sooner as needed

## 2017-04-08 NOTE — Progress Notes (Signed)
[address] [place] [address] Dept: [phone]  FAMILY NURSE PRACTITIONER FOLLOW UP NOTE  Patient ID: Jacqueline Fischer, female    DOB: [birth year]  Age: 62 y.o. MRN: [id number] Date of Office Visit: 04/08/2017  Assessment  Chief Complaint: Breathing Problem (Pt presents to discuss shortness of breath since Dec. 31st. Pt states she visited PCP and they prescribed Prednisone and antibiotic.)  HPI Jacqueline Fischer is a 62 year old female patient who presents to the clinic today for a sick visit.  She was last seen in this office on on 06/30/2016 by Dr. Sharyn Lull for evaluation of allergic rhinitis and nasal polyposis.  It was noted at that time that she was doing well and did not have facial pressure, had very little nasal discharge, and her cough had resolved.  At today's visit, she reports that she is concerned about a cough that remains from an episode of acute sinusitis and bronchitis that was diagnosed on March 29, 2017.  Troyce reports that she saw her primary care doctor and was treated with Augmentin and a prednisone taper on March 29, 2017.  She reports today that she is feeling much better but still feels tired and has a cough that is producing white sputum she has an upcoming cataract surgery on January 24 and she wants to 3 of the cough before the surgery.    She reports her asthma as well controlled.  She does report shortness of breath with activity especially going upstairs.  She did have wheezing when she presented to the primary care doctor which has since resolved.  She does report a cough that is worse in the morning with and lessens throughout the day.  She reports this is getting better.  She is currently taking Singulair 10 mg once a day.  Allergic rhinitis is reported as well controlled with Qnasl as needed.  She reports she does not take this every day as it frequently triggers her migraine pattern.  She does not currently take an antihistamine.   Drug  Allergies:  Allergies  Allergen Reactions  . Hydrocodone Nausea And Vomiting  . Oxycodone Nausea And Vomiting  . Tetracyclines & Related Rash and Other (See Comments)    Childhood allergy    Physical Exam: BP 130/86 (BP Location: Right Arm, Patient Position: Sitting, Cuff Size: Normal)   Pulse (!) 102   Ht 5\' 2"  (1.575 m)   Wt 179 lb 9.6 oz (81.5 kg)   SpO2 97%   BMI 32.85 kg/m    Physical Exam  Constitutional: She is oriented to person, place, and time. She appears well-developed and well-nourished.  HENT:  Right Ear: External ear normal.  Left Ear: External ear normal.  Bilateral nares erythematous and edematous with clear white drainage noted.  Slightly erythematous.  Ears normal.  Eyes normal.  Eyes: Conjunctivae are normal.  Neck: Normal range of motion. Neck supple.  Cardiovascular: Normal rate, regular rhythm and normal heart sounds.  S1-S2 normal.  Regular heart rate and rhythm.  No murmur noted.  Pulmonary/Chest: Effort normal and breath sounds normal.  Lungs clear to auscultation  Musculoskeletal: Normal range of motion.  Neurological: She is alert and oriented to person, place, and time.  Skin: Skin is warm and dry.  Psychiatric: She has a normal mood and affect. Her behavior is normal.    Diagnostics: FVC 2.11, FEV1 1.85.  Predicted FVC 2.36, predicted FEV1 1.85.  Spirometry is within the normal range.  Assessment and Plan: 1. Seasonal and  perennial allergic rhinitis   2. Nasal polyposis   3. Cough     Meds ordered this encounter  Medications  . Beclomethasone Dipropionate (QNASL) 80 MCG/ACT AERS    Sig: Place 80 mcg into the nose daily.    Dispense:  8.7 g    Refill:  3    Patient Instructions  1. Allergic rhinitis - Continue allergen avoidance measures - Continue QNASAL 1-2 sprays in each nostril once a day for a stuffy nose - Over the counter antihistamines as needed for a runny nose - Mucinex [phone] mg twice a day as needed for thick post  nasal drip (guaifenisen)  - Increase hydration as tolerated to thin out nasal secretions - Continue montelukast 10 mg once a day  Follow up in 6 months or sooner as needed     Return in about 6 months (around 10/06/2017), or if symptoms worsen or fail to improve.    Thank you for the opportunity to care for this patient.  Please do not hesitate to contact me with questions.  Thermon LeylandAnne Teigan Manner, FNP [facility]

## 2017-06-09 ENCOUNTER — Telehealth: Payer: Self-pay | Admitting: Allergy and Immunology

## 2017-06-09 ENCOUNTER — Other Ambulatory Visit: Payer: Self-pay

## 2017-06-09 MED ORDER — MONTELUKAST SODIUM 10 MG PO TABS: 10.0000 mg | ORAL_TABLET | Freq: Every day | ORAL | 3 refills | Status: DC

## 2017-06-09 MED ORDER — BECLOMETHASONE DIPROPIONATE 80 MCG/ACT NA AERS: 80.0000 ug | INHALATION_SPRAY | Freq: Every day | NASAL | 3 refills | Status: DC

## 2017-06-09 NOTE — Telephone Encounter (Signed)
Please inform patient that we need to obtain a sinus CT scan to evaluate what is going on in her head and she needs to make a follow-up appointment to see me in clinic.

## 2017-06-09 NOTE — Telephone Encounter (Signed)
Has had 4 sinus infections since December. Was wondering what decongestion would help her the best with this issue?

## 2017-06-09 NOTE — Telephone Encounter (Signed)
Patient is calling about recurrent sinus infections Went to the dentist thinking she needed a root canal and it was sinus infection and congestion Dentist told patient to call and request a medication with a "D" Like claritin-D in addition to her normal meds Please call patient with any questions

## 2017-06-10 NOTE — Telephone Encounter (Signed)
Have her come in to see me in clinic and continue on the Augmenti given by the dentist
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
## 2017-06-10 NOTE — Telephone Encounter (Signed)
Please inform patient that usually people are not put in a whole body scanner for a head CT scan. This would not be like a MRI where her whole body goes in the machine. It would probably take about 5 minutes to get the study completed.

## 2017-06-10 NOTE — Telephone Encounter (Signed)
LMOVM for return call.  

## 2017-06-10 NOTE — Telephone Encounter (Signed)
Pt is adamant about not getting the CT scan done. She states, remembering having some dental work done Monday [facility] where they did a CT 3-D scan that showed a massive sinus infection. They put her on Augmentin for 10 days. Wanted to know if we could obtain her Ct from there?

## 2017-06-11 NOTE — Telephone Encounter (Signed)
Called and left message for patient to call office in regards to this matter. 

## 2017-06-11 NOTE — Telephone Encounter (Signed)
Patient called back and was informed of Dr. Kathyrn LassKozlow's recommendation and schedule an appointment.

## 2017-06-15 ENCOUNTER — Ambulatory Visit: Payer: BLUE CROSS/BLUE SHIELD | Admitting: Allergy and Immunology

## 2017-06-15 ENCOUNTER — Encounter: Payer: Self-pay | Admitting: Allergy and Immunology

## 2017-06-15 VITALS — BP 130/86 | HR 96 | Resp 16

## 2017-06-15 DIAGNOSIS — J0140 Acute pansinusitis, unspecified: Secondary | ICD-10-CM | POA: Diagnosis not present

## 2017-06-15 DIAGNOSIS — J302 Other seasonal allergic rhinitis: Secondary | ICD-10-CM

## 2017-06-15 DIAGNOSIS — J339 Nasal polyp, unspecified: Secondary | ICD-10-CM

## 2017-06-15 DIAGNOSIS — J3089 Other allergic rhinitis: Secondary | ICD-10-CM

## 2017-06-15 NOTE — Patient Instructions (Signed)
  1. Continue to perform Allergen avoidance measures as best as possible  2. Continue to Treat and prevent inflammation through spring:   A. Qnasl 80 - 1-2 puffs each nostril one time per day  B. montelukast 10 mg - one tablet one time per day  3. If needed:   A. nasal saline wash  B. OTC antihistamine - Claritin/Allegra/Zyrtec  C. Pazeo - one drop each eye one time per day  4. Finish Augmentin  5. Return to clinic in 12 months or earlier if problem

## 2017-06-15 NOTE — Progress Notes (Signed)
Follow-up Note  Referring Provider: Kirby Funk, MD Primary Provider: Kirby Funk, MD Date of Office Visit: 06/15/2017  Subjective:   Jacqueline Fischer (DOB: May 18, 1955) is a 62 y.o. female who [facility]2017 in re-evaluation of the following:  HPI: Jacqueline Fischer presents to this clinic in evaluation of her allergic rhinitis and nasal polyposis.  I last saw her in this clinic 30 June 2016.  Apparently she did relatively well regarding her atopic respiratory disease and rarely used nasal steroid but consistently used a Singulair but unfortunately about 2 or 3 weeks ago she developed right upper tooth pain and she visited with her dentist who performed an x-ray of her sinuses and told her that she had sinusitis and started her on Augmentin.  She is much better at this point in time yet she still continues to have sniffles and nasal congestion and some mucus that oscillate between clear and yellow.  She has not been using her nasal steroid and most recently has not been using her montelukast.  Allergies as of 06/15/2017      Reactions   Hydrocodone Nausea And Vomiting   Oxycodone Nausea And Vomiting   Tetracyclines & Related Rash, Other (See Comments)   Childhood allergy      Medication List      amoxicillin-clavulanate 875-125 MG tablet Commonly known as:  AUGMENTIN   AXERT 12.5 MG tablet Generic drug:  almotriptan Take 12.5 mg by mouth as needed for migraine. may repeat in 2 hours if needed   Beclomethasone Dipropionate 80 MCG/ACT Aers Commonly known as:  QNASL Use one to two puffs in each nostril once daily.   Beclomethasone Dipropionate 80 MCG/ACT Aers Commonly known as:  QNASL Place 80 mcg into the nose daily.   FLAX SEEDS PO Take 2 tablets by mouth daily.   montelukast 10 MG tablet Commonly known as:  SINGULAIR Take 1 tablet (10 mg total) by mouth at bedtime.   VITAMIN C PO Take by mouth.   VITAMIN D PO Take by mouth.         Past Medical History:  Diagnosis Date  . Anemia    Years ago  . Arthritis   . Colitis    1970's  . Complication of anesthesia    pt states during nasal surgery she woke up during procedure  . History of hiatal hernia   . Migraine   . Pneumonia   . PONV (postoperative nausea and vomiting)     Past Surgical History:  Procedure Laterality Date  . CHOLECYSTECTOMY  10/19/2014   laproscopic  . CHOLECYSTECTOMY N/A 10/19/2014   Procedure: LAPAROSCOPIC CHOLECYSTECTOMY;  Surgeon: Jimmye Norman, MD;  Location: [facility] OR;  Service: General;  Laterality: N/A;  . COLONOSCOPY    . NASAL POLYP SURGERY    . TUBAL LIGATION      Review of systems negative except as noted in HPI / PMHx or noted below:  Review of Systems  Constitutional: Negative.   HENT: Negative.   Eyes: Negative.   Respiratory: Negative.   Cardiovascular: Negative.   Gastrointestinal: Negative.   Genitourinary: Negative.   Musculoskeletal: Negative.   Skin: Negative.   Neurological: Negative.   Endo/Heme/Allergies: Negative.   Psychiatric/Behavioral: Negative.      Objective:   Vitals:   06/15/17 1337  BP: 130/86  Pulse: 96  Resp: 16          Physical Exam  Constitutional: She is well-developed, well-nourished, and in  no distress.  Slightly nasal voice  HENT:  Head: Normocephalic.  Right Ear: Tympanic membrane, external ear and ear canal normal.  Left Ear: Tympanic membrane, external ear and ear canal normal.  Nose: Mucosal edema present. No rhinorrhea.  Mouth/Throat: Uvula is midline, oropharynx is clear and moist and mucous membranes are normal. No oropharyngeal exudate.  Eyes: Conjunctivae are normal.  Neck: Trachea normal. No tracheal tenderness present. No tracheal deviation present. No thyromegaly present.  Cardiovascular: Normal rate, regular rhythm, S1 normal, S2 normal and normal heart sounds.  No murmur heard. Pulmonary/Chest: Breath sounds normal. No stridor. No respiratory distress. She  has no wheezes. She has no rales.  Musculoskeletal: She exhibits no edema.  Lymphadenopathy:       Head (right side): No tonsillar adenopathy present.       Head (left side): No tonsillar adenopathy present.    She has no cervical adenopathy.  Neurological: She is alert. Gait normal.  Skin: No rash noted. She is not diaphoretic. No erythema. Nails show no clubbing.  Psychiatric: Mood and affect normal.    Diagnostics: none  Assessment and Plan:   1. Seasonal and perennial allergic rhinitis   2. Nasal polyposis   3. Acute non-recurrent pansinusitis     1. Continue to perform Allergen avoidance measures as best as possible  2. Continue to Treat and prevent inflammation through spring:   A. Qnasl 80 - 1-2 puffs each nostril one time per day  B. montelukast 10 mg - one tablet one time per day  3. If needed:   A. nasal saline wash  B. OTC antihistamine - Claritin/Allegra/Zyrtec  C. Pazeo - one drop each eye one time per day  4. Finish Augmentin  5. Return to clinic in 12 months or earlier if problem  It does appears though Jacqueline Fischer has recently developed an episode of sinusitis that is responding appropriately to the use of Augmentin prescribed by her dentist.  Given her atopic disease and her history of nasal polyposis I think that she should use a nasal steroid on a relatively consistent basis along with a leukotriene modifier and I have informed her about those suggestions today.  If she has a difficult time as she goes through this upcoming spring time season she can contact me for further evaluation and treatment.  If she does well I will just see her back in this clinic in 1 year.  Laurette SchimkeEric Kozlow, MD Allergy / Immunology [facility]

## 2017-06-16 ENCOUNTER — Encounter: Payer: Self-pay | Admitting: Allergy and Immunology

## 2018-06-09 ENCOUNTER — Encounter: Payer: Self-pay | Admitting: Obstetrics & Gynecology

## 2018-07-26 ENCOUNTER — Other Ambulatory Visit: Payer: Self-pay | Admitting: Allergy and Immunology

## 2018-07-28 ENCOUNTER — Encounter: Payer: Self-pay | Admitting: Allergy and Immunology

## 2018-07-28 ENCOUNTER — Ambulatory Visit (INDEPENDENT_AMBULATORY_CARE_PROVIDER_SITE_OTHER): Payer: BLUE CROSS/BLUE SHIELD | Admitting: Allergy and Immunology

## 2018-07-28 ENCOUNTER — Other Ambulatory Visit: Payer: Self-pay

## 2018-07-28 DIAGNOSIS — J3089 Other allergic rhinitis: Secondary | ICD-10-CM

## 2018-07-28 DIAGNOSIS — J339 Nasal polyp, unspecified: Secondary | ICD-10-CM

## 2018-07-28 DIAGNOSIS — J302 Other seasonal allergic rhinitis: Secondary | ICD-10-CM | POA: Diagnosis not present

## 2018-07-28 MED ORDER — MONTELUKAST SODIUM 10 MG PO TABS: 10.0000 mg | ORAL_TABLET | Freq: Every day | ORAL | 11 refills | Status: DC

## 2018-07-28 MED ORDER — BECLOMETHASONE DIPROPIONATE 80 MCG/ACT NA AERS: INHALATION_SPRAY | NASAL | 11 refills | Status: AC

## 2018-07-28 MED ORDER — OLOPATADINE HCL 0.7 % OP SOLN: 1.0000 [drp] | Freq: Every day | OPHTHALMIC | 11 refills | Status: DC | PRN

## 2018-07-28 NOTE — Patient Instructions (Signed)
  1. Continue to perform Allergen avoidance measures as best as possible  2. Continue to Treat and prevent inflammation through spring:   A. Qnasl 80 - 1-2 puffs each nostril one time per day  B. montelukast 10 mg - one tablet one time per day  3. If needed:   A. nasal saline wash  B. OTC antihistamine - Claritin/Allegra/Zyrtec  C. Pazeo - one drop each eye one time per day  4. Return to clinic in 12 months or earlier if problem

## 2018-07-28 NOTE — Progress Notes (Signed)
[facility] [place] [facility] [place] [facility] [place] - [place]   Follow-up Note  Referring Provider: Kirby Funk, MD Primary Provider: Kirby Funk, MD Date of Office Visit: 07/28/2018  Subjective:   CRYSTALINA AVALLONE (DOB: 12/21/1955) is a 63 y.o. female who [facility] in re-evaluation of the following:  HPI: This is a E - Med visit requested by patient who is located at home.  I have seen Joneen in this clinic for allergic rhinitis and nasal polyposis and a history of sinusitis.  Her last visit to this clinic was 15 June 2017.  Doing well. Can smell. Can taste. Sleep not disturbed. No antibiotics or systemic steroids. Using nasal steroid and montelukast.  Did not receive flu vaccine.  Allergies as of 07/28/2018      Reactions   Hydrocodone Nausea And Vomiting   Oxycodone Nausea And Vomiting   Tetracyclines & Related Rash, Other (See Comments)   Childhood allergy      Medication List      Axert 12.5 MG tablet Generic drug:  almotriptan Take 12.5 mg by mouth as needed for migraine. may repeat in 2 hours if needed   Beclomethasone Dipropionate 80 MCG/ACT Aers Commonly known as:  Qnasl Use one to two puffs in each nostril once daily.   montelukast 10 MG tablet Commonly known as:  SINGULAIR Take 1 tablet (10 mg total) by mouth at bedtime.   VITAMIN C PO Take by mouth.   VITAMIN D PO Take by mouth.       Past Medical History:  Diagnosis Date  . Anemia    Years ago  . Arthritis   . Colitis    1970's  . Complication of anesthesia    pt states during nasal surgery she woke up during procedure  . History of hiatal hernia   . Migraine   . Pneumonia   . PONV (postoperative nausea and vomiting)     Past Surgical History:  Procedure Laterality Date  . CHOLECYSTECTOMY  10/19/2014   laproscopic  . CHOLECYSTECTOMY N/A 10/19/2014   Procedure: LAPAROSCOPIC CHOLECYSTECTOMY;  Surgeon: Jimmye Norman, MD;  Location: [facility] OR;   Service: General;  Laterality: N/A;  . COLONOSCOPY    . NASAL POLYP SURGERY    . TUBAL LIGATION      Review of systems negative except as noted in HPI / PMHx or noted below:  Review of Systems  Constitutional: Negative.   HENT: Negative.   Eyes: Negative.   Respiratory: Negative.   Cardiovascular: Negative.   Gastrointestinal: Negative.   Genitourinary: Negative.   Musculoskeletal: Negative.   Skin: Negative.   Neurological: Negative.   Endo/Heme/Allergies: Negative.   Psychiatric/Behavioral: Negative.      Objective:   There were no vitals filed for this visit.        Physical Exam-deferred  Diagnostics: none  Assessment and Plan:   1. Seasonal and perennial allergic rhinitis   2. Nasal polyposis     1. Continue to perform Allergen avoidance measures as best as possible  2. Continue to Treat and prevent inflammation through spring:   A. Qnasl 80 - 1-2 puffs each nostril one time per day  B. montelukast 10 mg - one tablet one time per day  3. If needed:   A. nasal saline wash  B. OTC antihistamine - Claritin/Allegra/Zyrtec  C. Pazeo - one drop each eye one time per day  4. Return to clinic in 12 months or earlier  if problem  Rosey Batheresa appears to be doing very well on her current therapy.  Fortunately, based upon symptoms, it does not appear as though her nasal polyps have returned.  She will continue on the anti-inflammatory medication as noted above for her airway and I will see her back in this clinic in 12 months or earlier if there is a problem.  Total patient interaction time 15 minutes.  Laurette SchimkeEric Kozlow, MD Allergy / Immunology [facility]

## 2018-08-01 ENCOUNTER — Encounter: Payer: Self-pay | Admitting: Allergy and Immunology

## 2018-08-01 NOTE — Progress Notes (Signed)
Patient is at home. Provider is in the office.  Start time: 2:45 pm  End time: 3:05 pm Verbal consent was given to bill insurance.

## 2018-08-15 ENCOUNTER — Telehealth: Payer: Self-pay | Admitting: *Deleted

## 2018-08-15 MED ORDER — OLOPATADINE HCL 0.1 % OP SOLN: 1.0000 [drp] | Freq: Two times a day (BID) | OPHTHALMIC | 5 refills | Status: DC

## 2018-08-15 NOTE — Telephone Encounter (Signed)
Patient's insurance states that Azelastine .05, Epinastine 0.05, Olopatadine 0.1%, and 0.2% are preferred to [facility] and more cost efficient to the patient. Please advise.

## 2018-08-15 NOTE — Telephone Encounter (Signed)
Call to patient to notify of change in eye drop.  Pt was okay with this. No further questions. Verbalized understanding.   Ended call.

## 2018-08-15 NOTE — Addendum Note (Signed)
Addended by: Teressa Senter on: 08/15/2018 02:33 PM   Modules accepted: Orders

## 2018-08-15 NOTE — Addendum Note (Signed)
Addended by: Teressa Senter on: 08/15/2018 02:37 PM   Modules accepted: Orders

## 2018-08-15 NOTE — Telephone Encounter (Signed)
Olopatadine 0.1% 1 drop each eye twice a day.  Please inform patient

## 2018-08-16 ENCOUNTER — Encounter

## 2018-08-16 ENCOUNTER — Encounter: Payer: BLUE CROSS/BLUE SHIELD | Admitting: Obstetrics & Gynecology

## 2018-11-08 ENCOUNTER — Encounter: Payer: BLUE CROSS/BLUE SHIELD | Admitting: Obstetrics & Gynecology

## 2019-01-11 ENCOUNTER — Other Ambulatory Visit: Payer: Self-pay

## 2019-01-11 DIAGNOSIS — Z20822 Contact with and (suspected) exposure to covid-19: Secondary | ICD-10-CM

## 2019-01-12 LAB — NOVEL CORONAVIRUS, NAA: SARS-CoV-2, NAA: NOT DETECTED

## 2019-01-20 ENCOUNTER — Other Ambulatory Visit: Payer: Self-pay

## 2019-01-24 ENCOUNTER — Ambulatory Visit: Payer: BC Managed Care – PPO | Admitting: Obstetrics & Gynecology

## 2019-01-24 ENCOUNTER — Other Ambulatory Visit: Payer: Self-pay

## 2019-01-24 ENCOUNTER — Encounter: Payer: Self-pay | Admitting: Obstetrics & Gynecology

## 2019-01-24 VITALS — BP 142/80 | HR 80 | Temp 97.3°F | Resp 12 | Ht 62.0 in | Wt 165.0 lb

## 2019-01-24 DIAGNOSIS — E2839 Other primary ovarian failure: Secondary | ICD-10-CM

## 2019-01-24 DIAGNOSIS — Z01419 Encounter for gynecological examination (general) (routine) without abnormal findings: Secondary | ICD-10-CM

## 2019-01-24 NOTE — Progress Notes (Signed)
63 y.o. G86P2002 Divorced White or Caucasian female here as a new patient for an annual exam. Patient has seen Dr. Arlyce Dice with [facility] in the past.  Denies vaginal bleeding.  Never took hormonal therapy.     PCP:  Dr. Kirby Funk.  Has seen him this year.  Did have blood work.  She reports this is normal.  hbA1C was elevated.  It normalized with weight loss.    No LMP recorded. Patient is postmenopausal.          Sexually active: No.  The current method of family planning is post menopausal status.    Exercising: Yes.    tap dance class, walking, aerobics Smoker:  no  Health Maintenance: Pap:  2019 normal per patient with Dr. Arlyce Dice at [facility] History of abnormal Pap:  Yes, years ago; per patient normal paps since -- Colpo done (at least 20 years ago) MMG:  About 3-4 years ago with Dr. Arlyce Dice Colonoscopy:  About 2011 per patient done with Dr. Loreta Ave -- normal f/u 10 years BMD:   never TDaP:  UTD per patient  Pneumonia vaccine(s):  never Shingrix:   never Hep C testing: never Screening Labs: PCP   reports that she quit smoking about 45 years ago. Her smoking use included cigarettes. She has never used smokeless tobacco. She reports that she does not drink alcohol or use drugs.  Past Medical History:  Diagnosis Date  . Anemia    Years ago  . Arthritis   . Colitis    1970's  . Complication of anesthesia    pt states during nasal surgery she woke up during procedure  . Migraine   . Pneumonia   . PONV (postoperative nausea and vomiting)     Past Surgical History:  Procedure Laterality Date  . CATARACT EXTRACTION Right   . CHOLECYSTECTOMY  10/19/2014   laproscopic  . CHOLECYSTECTOMY N/A 10/19/2014   Procedure: LAPAROSCOPIC CHOLECYSTECTOMY;  Surgeon: Jimmye Norman, MD;  Location: [facility] OR;  Service: General;  Laterality: N/A;  . COLONOSCOPY    . COLPOSCOPY    . NASAL POLYP SURGERY    . TUBAL LIGATION      Current Outpatient Medications  Medication Sig  Dispense Refill  . almotriptan (AXERT) 12.5 MG tablet Take 12.5 mg by mouth as needed for migraine. may repeat in 2 hours if needed    . Ascorbic Acid (VITAMIN C PO) Take by mouth.    . Beclomethasone Dipropionate (QNASL) 80 MCG/ACT AERS Use one to two puffs in each nostril once daily. 1 Inhaler 11  . Cholecalciferol (VITAMIN D PO) Take by mouth.    . montelukast (SINGULAIR) 10 MG tablet Take 1 tablet (10 mg total) by mouth at bedtime. 30 tablet 11   No current facility-administered medications for this visit.     Family History  Problem Relation Age of Onset  . Hypertension Mother   . Diabetes type II Mother   . Renal Disease Mother   . Dementia Father   . Congestive Heart Failure Father   . Stroke Father   . Hypertension Father   . Renal Disease Brother   . Diabetes type II Brother   . Hypertension Sister   . Diabetes Maternal Grandmother     Review of Systems  Constitutional: Negative.   HENT: Negative.   Eyes: Negative.   Respiratory: Negative.   Cardiovascular: Negative.   Gastrointestinal: Negative.   Endocrine: Negative.   Genitourinary: Negative.   Musculoskeletal: Negative.  Skin: Negative.   Allergic/Immunologic: Negative.   Neurological: Negative.   Hematological: Negative.   Psychiatric/Behavioral: Negative.     Exam:   BP (!) 142/80 (BP Location: Right Arm, Patient Position: Sitting, Cuff Size: Normal)   Pulse 80   Temp (!) 97.3 F (36.3 C) (Temporal)   Resp 12   Ht 5\' 2"  (1.575 m)   Wt 165 lb (74.8 kg)   BMI 30.18 kg/m    Height: 5\' 2"  (157.5 cm)  Ht Readings from Last 3 Encounters:  01/24/19 5\' 2"  (1.575 m)  04/08/17 5\' 2"  (1.575 m)  06/02/16 5\' 1"  (1.549 m)   No exam performed today.  Pt is going to return for this.  Needed to go to an appt with her mother.  Will be rescheduled to return for physical exam and establishment of plan.

## 2019-01-27 ENCOUNTER — Other Ambulatory Visit: Payer: Self-pay | Admitting: Obstetrics & Gynecology

## 2019-01-27 DIAGNOSIS — Z1231 Encounter for screening mammogram for malignant neoplasm of breast: Secondary | ICD-10-CM

## 2019-02-07 ENCOUNTER — Encounter: Payer: BC Managed Care – PPO | Admitting: Obstetrics & Gynecology

## 2019-02-13 ENCOUNTER — Ambulatory Visit: Payer: BC Managed Care – PPO | Admitting: Obstetrics & Gynecology

## 2019-02-13 ENCOUNTER — Other Ambulatory Visit: Payer: Self-pay

## 2019-02-13 ENCOUNTER — Encounter: Payer: Self-pay | Admitting: Obstetrics & Gynecology

## 2019-02-13 VITALS — BP 128/70 | HR 68 | Temp 97.6°F | Resp 12 | Ht 63.0 in | Wt 163.0 lb

## 2019-02-13 DIAGNOSIS — Z20822 Contact with and (suspected) exposure to covid-19: Secondary | ICD-10-CM

## 2019-02-13 DIAGNOSIS — Z01419 Encounter for gynecological examination (general) (routine) without abnormal findings: Secondary | ICD-10-CM | POA: Diagnosis not present

## 2019-02-13 DIAGNOSIS — Z205 Contact with and (suspected) exposure to viral hepatitis: Secondary | ICD-10-CM | POA: Diagnosis not present

## 2019-02-13 NOTE — Progress Notes (Addendum)
63 y.o. G23P2002 Divorced White or Caucasian female here for annual exam.  Denies vaginal bleeding.  Was only on HRT for 30 days when first transitioned into menopause, around age 32.  Pt was here 01/24/2019 but needed to leave to help with her mother.  Exam was not performed.  PCP:  Dr. Laurann Montana  No LMP recorded. Patient is postmenopausal.          Sexually active: No.  The current method of family planning is tubal ligation and post menopausal status.    Exercising: Yes.    tap dance class, walking, aerobics Smoker:  no  Health Maintenance: Pap:  12/16/2017 normal per patient with Dr. Deatra Ina at [place] History of abnormal Pap:  Yes, years ago; per patient normal paps since -- Colpo done (at least 20 years ago) MMG:   About 3-4 years ago with Dr. Deatra Ina -- scheduled 04/10/19 Colonoscopy:  About 2011 per patient done with Dr. Collene Mares -- normal f/u 10 years BMD:   Never -- scheduled 04/10/19 TDaP:  UTD per patient Pneumonia vaccine(s):  never  Shingrix:   never Hep C testing: never Screening Labs: PCP   reports that she quit smoking about 45 years ago. Her smoking use included cigarettes. She has never used smokeless tobacco. She reports that she does not drink alcohol or use drugs.  Past Medical History:  Diagnosis Date  . Anemia    Years ago  . Arthritis   . Colitis    1970's  . Complication of anesthesia    pt states during nasal surgery she woke up during procedure  . Migraine   . Pneumonia   . PONV (postoperative nausea and vomiting)     Past Surgical History:  Procedure Laterality Date  . CATARACT EXTRACTION Right   . CHOLECYSTECTOMY N/A 10/19/2014   Procedure: LAPAROSCOPIC CHOLECYSTECTOMY;  Surgeon: Judeth Horn, MD;  Location: [facility];  Service: General;  Laterality: N/A;  . NASAL POLYP SURGERY     6 polyps removed  . TUBAL LIGATION      Current Outpatient Medications  Medication Sig Dispense Refill  . almotriptan (AXERT) 12.5 MG tablet Take 12.5 mg by mouth  as needed for migraine. may repeat in 2 hours if needed    . Ascorbic Acid (VITAMIN C PO) Take by mouth.    . Beclomethasone Dipropionate (QNASL) 80 MCG/ACT AERS Use one to two puffs in each nostril once daily. 1 Inhaler 11  . Cholecalciferol (VITAMIN D PO) Take by mouth.    . montelukast (SINGULAIR) 10 MG tablet Take 1 tablet (10 mg total) by mouth at bedtime. 30 tablet 11   No current facility-administered medications for this visit.     Family History  Problem Relation Age of Onset  . Hypertension Mother   . Diabetes type II Mother   . Renal Disease Mother   . Dementia Father   . Congestive Heart Failure Father   . Stroke Father   . Hypertension Father   . Renal Disease Brother   . Diabetes type II Brother   . Hypertension Sister   . Diabetes Maternal Grandmother     Review of Systems  All other systems reviewed and are negative.   Exam:   BP 128/70 (BP Location: Right Arm, Patient Position: Sitting, Cuff Size: Normal)   Pulse 68   Temp 97.6 F (36.4 C) (Temporal)   Resp 12   Ht 5\' 3"  (1.6 m)   Wt 163 lb (73.9 kg)   BMI  28.87 kg/m    Height: 5\' 3"  (160 cm)  Ht Readings from Last 3 Encounters:  02/13/19 5\' 3"  (1.6 m)  01/24/19 5\' 2"  (1.575 m)  04/08/17 5\' 2"  (1.575 m)    General appearance: alert, cooperative and appears stated age Head: Normocephalic, without obvious abnormality, atraumatic Neck: no adenopathy, supple, symmetrical, trachea midline and thyroid normal to inspection and palpation Lungs: clear to auscultation bilaterally Breasts: normal appearance, no masses or tenderness Heart: regular rate and rhythm Abdomen: soft, non-tender; bowel sounds normal; no masses,  no organomegaly Extremities: extremities normal, atraumatic, no cyanosis or edema Skin: Skin color, texture, turgor normal. No rashes or lesions Lymph nodes: Cervical, supraclavicular, and axillary nodes normal. No abnormal inguinal nodes palpated Neurologic: Grossly normal   Pelvic:  External genitalia:  no lesions              Urethra:  normal appearing urethra with no masses, tenderness or lesions              Bartholins and Skenes: normal                 Vagina: normal appearing vagina with normal color and discharge, no lesions              Cervix: no lesions              Pap taken: Yes.   Bimanual Exam:  Uterus:  normal size, contour, position, consistency, mobility, non-tender              Adnexa: normal adnexa and no mass, fullness, tenderness               Rectovaginal: Confirms               Anus:  normal sphincter tone, no lesions  Chaperone was present for exam.  A:  Well Woman with normal exam PMP, no HRT Migraines Allergies  P:   Mammogram is scheduled for January BMD is scheduled for January as well pap smear obtained today.  Will hold until have records.  (Last pap smear is in outside records scanned into media tab dated 12/16/17.  Pap neg with neg HR HPV.  Obtained pap will be discarded.) Release for colonoscopy obtained today Blood work is done with Dr. (was done in the spring this year) Hep C antibody will be obtained today Return annually or prn

## 2019-02-14 LAB — HEPATITIS C ANTIBODY: Hep C Virus Ab: <0.1 s/co ratio (ref 0.0–0.9)

## 2019-02-15 LAB — NOVEL CORONAVIRUS, NAA: SARS-CoV-2, NAA: NOT DETECTED

## 2019-04-10 ENCOUNTER — Other Ambulatory Visit: Payer: Self-pay

## 2019-04-10 ENCOUNTER — Ambulatory Visit
Admission: RE | Admit: 2019-04-10 | Discharge: 2019-04-10 | Disposition: A | Discharge status: Home / Self Care | Payer: BLUE CROSS/BLUE SHIELD | Source: Ambulatory Visit | Attending: Obstetrics & Gynecology | Admitting: Obstetrics & Gynecology

## 2019-04-10 DIAGNOSIS — Z1231 Encounter for screening mammogram for malignant neoplasm of breast: Secondary | ICD-10-CM

## 2019-04-10 DIAGNOSIS — E2839 Other primary ovarian failure: Secondary | ICD-10-CM

## 2019-04-27 ENCOUNTER — Telehealth: Payer: Self-pay | Admitting: Obstetrics & Gynecology

## 2019-04-27 NOTE — Telephone Encounter (Signed)
Patient had recent bone density and would like clarification on the results so she can start a prescription of vitamin d if needed.

## 2019-04-27 NOTE — Telephone Encounter (Signed)
Pt called to ask about BMD results. Pt given Dr Rondel Baton note from BMD on 04/10/2019. Pt verbalized understanding and states will still take daily calcium and vit D.   Will route to Dr Hyacinth Meeker for review and will close encounter.

## 2019-04-27 NOTE — Telephone Encounter (Signed)
Left message for pt to call back to triage RN.  

## 2019-06-19 ENCOUNTER — Ambulatory Visit: Payer: BC Managed Care – PPO | Attending: Internal Medicine

## 2019-06-19 DIAGNOSIS — Z23 Encounter for immunization: Secondary | ICD-10-CM

## 2019-06-19 NOTE — Progress Notes (Signed)
   Covid-19 Vaccination Clinic  Name:  Jacqueline Fischer    MRN: [id number] DOB: 1955/05/12  06/19/2019  Jacqueline Fischer was observed post Covid-19 immunization for 15 minutes without incident. She was provided with Vaccine Information Sheet and instruction to access the V-Safe system.   Jacqueline Fischer was instructed to call 911 with any severe reactions post vaccine: Marland Kitchen Difficulty breathing  . Swelling of face and throat  . A fast heartbeat  . A bad rash all over body  . Dizziness and weakness   Immunizations Administered    Name Date Dose VIS Date Route   Pfizer COVID-19 Vaccine 06/19/2019 11:11 AM 0.3 mL 03/10/2019 Intramuscular   Manufacturer: [employer], [employer]   Lot: [id number]   NDC: [address]-9

## 2019-07-17 ENCOUNTER — Ambulatory Visit: Payer: BC Managed Care – PPO | Attending: Internal Medicine

## 2019-07-17 DIAGNOSIS — Z23 Encounter for immunization: Secondary | ICD-10-CM

## 2019-07-17 NOTE — Progress Notes (Signed)
   Covid-19 Vaccination Clinic  Name:  Jacqueline Fischer    MRN: [id number] DOB: 21-Jan-1956  07/17/2019  [name] was observed post Covid-19 immunization for 15 minutes without incident. She was provided with Vaccine Information Sheet and instruction to access the V-Safe system.   [name] was instructed to call 911 with any severe reactions post vaccine: Marland Kitchen Difficulty breathing  . Swelling of face and throat  . A fast heartbeat  . A bad rash all over body  . Dizziness and weakness   Immunizations Administered    Name Date Dose VIS Date Route   Pfizer COVID-19 Vaccine 07/17/2019 12:42 PM 0.3 mL 05/24/2018 Intramuscular   Manufacturer: [employer], [employer]   Lot: [id number]   NDC: [address]-6

## 2019-07-27 ENCOUNTER — Other Ambulatory Visit: Payer: Self-pay

## 2019-07-27 ENCOUNTER — Encounter: Payer: BC Managed Care – PPO | Attending: Internal Medicine | Admitting: Registered"

## 2019-07-27 ENCOUNTER — Encounter: Payer: Self-pay | Admitting: Registered"

## 2019-07-27 DIAGNOSIS — E119 Type 2 diabetes mellitus without complications: Secondary | ICD-10-CM | POA: Diagnosis present

## 2019-07-27 NOTE — Patient Instructions (Signed)
Goals:  Follow Diabetes Meal Plan as instructed  Eat 3 meals and 2 snacks, every 3-5 hrs  Limit carbohydrate intake to 30-45 grams carbohydrate/meal  Limit carbohydrate intake to 15-30 grams carbohydrate/snack  Add lean protein foods to meals/snacks  Monitor glucose levels as instructed by your doctor  Aim for 30 mins of physical activity daily  Bring food record and glucose log to your next nutrition visit  

## 2019-07-27 NOTE — Progress Notes (Signed)
Diabetes Self-Management Education  Visit Type:  First/Initial  Appt. Start Time: 2:10 Appt. End Time: 3:30  07/27/2019  Jacqueline Fischer, identified by name and date of birth, is a 64 y.o. female with a diagnosis of Diabetes: Type 2.   ASSESSMENT  Pt states mom is on dialysis and brother died of kidney failure. Causes fear for her related to diabetes. States she does not want to repeat what she has seen happen to her family. State she does not want to need dialysis.   States she has changed eating and drinking habits since being diagnosed 1 year ago: no longer drinking Pepsi's, drinking milkshake, eating cake or gummies. States she was drinking 2 Pepsis daily, a milkshake 6 days/week, and gummies daily.   There were no vitals taken for this visit. There is no height or weight on file to calculate BMI.   Diabetes Self-Management Education - 07/27/19 1421      Health Coping   How would you rate your overall health?  Good      Psychosocial Assessment   Patient Belief/Attitude about Diabetes  Motivated to manage diabetes   and afraid   Self-care barriers  None    Self-management support  Friends;Family;Doctor's office    Special Needs  None    Preferred Learning Style  No preference indicated    Learning Readiness  Change in progress      Complications   Last HgB A1C per patient/outside source  6.6 %    How often do you check your blood sugar?  0 times/day (not testing)    Number of hypoglycemic episodes per month  0    Number of hyperglycemic episodes per week  0    Have you had a dilated eye exam in the past 12 months?  Yes    Have you had a dental exam in the past 12 months?  Yes    Are you checking your feet?  Yes    How many days per week are you checking your feet?  7      Dietary Intake   Breakfast  [place] sandwich + chips + water    Lunch  steak fajita (lettuce, tomato, sour cream) + water    Dinner  grilled wings + salad + yogurt (almonds/nuts)    Snack  (evening)  2 Reese's thin white chocolate    Beverage(s)  water, sparkling apple cider, sprite (special occasions)      Exercise   Exercise Type  Moderate (swimming / aerobic walking);Light (walking / raking leaves)   water aerobics, tap dancing   How many days per week to you exercise?  4    How many minutes per day do you exercise?  60    Total minutes per week of exercise  240      Patient Education   Previous Diabetes Education  Yes (please comment)    Disease state   Definition of diabetes, type 1 and 2, and the diagnosis of diabetes;Factors that contribute to the development of diabetes    Nutrition management   Role of diet in the treatment of diabetes and the relationship between the three main macronutrients and blood glucose level;Food label reading, portion sizes and measuring food.;Effects of alcohol on blood glucose and safety factors with consumption of alcohol.;Information on hints to eating out and maintain blood glucose control.;Reviewed blood glucose goals for pre and post meals and how to evaluate the patients' food intake on their blood glucose level.  Physical activity and exercise   Role of exercise on diabetes management, blood pressure control and cardiac health.    Monitoring  Yearly dilated eye exam;Daily foot exams;Identified appropriate SMBG and/or A1C goals.;Interpreting lab values - A1C, lipid, urine microalbumina.;Taught/discussed recording of test results and interpretation of SMBG.;Purpose and frequency of SMBG.    Acute complications  Taught treatment of hypoglycemia - the 15 rule.;Discussed and identified patients' treatment of hyperglycemia.    Chronic complications  Applicable immunizations;Nephropathy, what it is, prevention of, the use of ACE, ARB's and early detection of through urine microalbumia.;Retinopathy and reason for yearly dilated eye exams;Dental care;Identified and discussed with patient  current chronic complications;Lipid levels, blood glucose  control and heart disease;Assessed and discussed foot care and prevention of foot problems;Relationship between chronic complications and blood glucose control;Reviewed with patient heart disease, higher risk of, and prevention    Psychosocial adjustment  Role of stress on diabetes      Individualized Goals (developed by patient)   Nutrition  General guidelines for healthy choices and portions discussed    Physical Activity  Exercise 3-5 times per week;Exercise 5-7 days per week;30 minutes per day    Medications  Not Applicable    Monitoring   Not Applicable    Reducing Risk  do foot checks daily;increase portions of nuts and seeds;treat hypoglycemia with 15 grams of carbs if blood glucose less than 70mg /dL;examine blood glucose patterns      Post-Education Assessment   Patient understands the diabetes disease and treatment process.  Demonstrates understanding / competency    Patient understands incorporating nutritional management into lifestyle.  Demonstrates understanding / competency    Patient undertands incorporating physical activity into lifestyle.  Demonstrates understanding / competency    Patient understands using medications safely.  Demonstrates understanding / competency    Patient understands monitoring blood glucose, interpreting and using results  Demonstrates understanding / competency    Patient understands prevention, detection, and treatment of acute complications.  Demonstrates understanding / competency    Patient understands prevention, detection, and treatment of chronic complications.  Demonstrates understanding / competency    Patient understands how to develop strategies to address psychosocial issues.  Demonstrates understanding / competency    Patient understands how to develop strategies to promote health/change behavior.  Demonstrates understanding / competency      Outcomes   Program Status  Completed       Learning Objective:  Patient will have a greater  understanding of diabetes self-management. Patient education plan is to attend individual and/or group sessions per assessed needs and concerns.   Plan:   Patient Instructions  Goals:  Follow Diabetes Meal Plan as instructed  Eat 3 meals and 2 snacks, every 3-5 hrs  Limit carbohydrate intake to 30-45 grams carbohydrate/meal  Limit carbohydrate intake to 15-30 grams carbohydrate/snack  Add lean protein foods to meals/snacks  Monitor glucose levels as instructed by your doctor  Aim for 30 mins of physical activity daily  Bring food record and glucose log to your next nutrition visit     Expected Outcomes:  Demonstrated interest in learning. Expect positive outcomes  Education material provided: ADA - How to Thrive: A Guide for Your Journey with Diabetes  If problems or questions, patient to contact team via:  Phone and Email  Future DSME appointment: - PRN

## 2020-07-21 NOTE — Patient Instructions (Addendum)
Seasonal and perennial allergic rhinitis Stop Qnasl 80 since this caused headaches Try a sample of Xhance 1-2 sprays each nostril twice a day. If this does not cause a headache call our office and we will send in a prescription Re-start montelukast 10 mg once a day. Patient cautioned that rarely some children/adults can experience behavioral changes after beginning montelukast. These side effects are rare, however, if you notice any change, notify the clinic and discontinue montelukast. May use an over the counter antihistamine once a day as needed for runny nose/itching such as Claritin, Zyrtec, Allegra, or Xyzal May use saline rinse as needed for nasal symptoms. Use prior to any medicated nasal sprays  Nasal Polyposis Treatment as above Pamphlet information given on Dupixent  Shortness of breath with exertion Your breathing test was normal today. This could be due to deconditioning Continue to monitor  Please let us know if this treatment plan is not working well for you Schedule a follow up appointment in 2 months

## 2020-07-22 ENCOUNTER — Other Ambulatory Visit: Payer: Self-pay

## 2020-07-22 ENCOUNTER — Ambulatory Visit (INDEPENDENT_AMBULATORY_CARE_PROVIDER_SITE_OTHER): Payer: BC Managed Care – PPO | Admitting: Family

## 2020-07-22 ENCOUNTER — Encounter: Payer: Self-pay | Admitting: Family

## 2020-07-22 VITALS — BP 120/78 | HR 82 | Temp 97.7°F | Resp 18 | Ht 62.0 in | Wt 179.0 lb

## 2020-07-22 DIAGNOSIS — R0600 Dyspnea, unspecified: Secondary | ICD-10-CM

## 2020-07-22 DIAGNOSIS — R0609 Other forms of dyspnea: Secondary | ICD-10-CM

## 2020-07-22 DIAGNOSIS — J3089 Other allergic rhinitis: Secondary | ICD-10-CM | POA: Diagnosis not present

## 2020-07-22 DIAGNOSIS — J339 Nasal polyp, unspecified: Secondary | ICD-10-CM

## 2020-07-22 DIAGNOSIS — J302 Other seasonal allergic rhinitis: Secondary | ICD-10-CM | POA: Diagnosis not present

## 2020-07-22 MED ORDER — MONTELUKAST SODIUM 10 MG PO TABS: 10.0000 mg | ORAL_TABLET | Freq: Every day | ORAL | 5 refills | Status: AC

## 2020-07-22 NOTE — Progress Notes (Signed)
[address] Jacqueline Fischer [place] [address] Dept: [phone]  FOLLOW UP NOTE  Patient ID: Jacqueline Fischer, female    DOB: 1955/10/17  Age: 65 y.o. MRN: [id number] Date of Office Visit: 07/22/2020  Assessment  Chief Complaint: Allergic Rhinitis  (Congestion was gradening this weekend, has been using mucinex it has helped a little ) and Results  HPI Jacqueline Fischer a 65 year old female who presents today for follow-up of seasonal and perennial allergic rhinitis and nasal polyposis.  She was last seen on July 28, 2018 by Dr. Lucie Leather.  Seasonal and perennial allergic rhinitis is reported as not well controlled with Mucinex.  She reports that she was taking Allegra and then 1 time when she had a cold she stopped the Allegra and took Mucinex and it felt better.  She does not use Qnasl nasal spray due to it causing her migraines to flareup.  She reports that she is never found a nasal spray that does not cause her migraines to flare.  She has tried Flonase nasal spray in the past.  She reports nasal congestion, postnasal drip, and very seldomly does she have rhinorrhea.  She has had 2 sinus infections since her last office visit with [place].  She reports that she can tell that her nasal polyposis came back on the right side and has been there for well over a year.  Discussed options of trying Xhance and a pamphlet was given on Dupixent injections.  She reports that she is willing to try [facility], because it will only take 1 spray of Xhance to know if it will cause her migraines to flare.  She reports that she had sinus surgery around 2017 and is not interested in having sinus surgery again.  She reports that she has been having shortness of breath when she climbs steps at work.  This started sometime last year.  She denies any wheezing, tightness in her chest, and coughing.  She reports that she smoked socially for a year in college and has never been diagnosed with asthma.  She also mentions that her dad  had breathing problems to where he had to use oxygen.  She never knew what he was diagnosed with.   Drug Allergies:  Allergies  Allergen Reactions  . Hydrocodone Nausea And Vomiting  . Oxycodone Nausea And Vomiting  . Tetracyclines & Related Rash and Other (See Comments)    Childhood allergy    Review of Systems: Review of Systems  Constitutional: Negative for chills and fever.  HENT:       Reports nasal congestion and postnasal drip.  Very seldom has rhinorrhea.  Eyes:       Reports occasional itchy watery eyes but they are not that bad as to where she needs an eyedrop.  Respiratory: Positive for shortness of breath. Negative for cough and wheezing.        Reports shortness of breath with exertion.  Denies coughing wheezing tightness in chest  Cardiovascular: Negative for chest pain and palpitations.  Gastrointestinal: Negative for heartburn.  Genitourinary: Negative for dysuria.  Skin: Positive for itching.       Reports itching due to dry skin  Neurological: Positive for headaches.       Reports history of migraines  Endo/Heme/Allergies: Positive for environmental allergies.    Physical Exam: BP 120/78   Pulse 82   Temp 97.7 F (36.5 C)   Resp 18   Ht 5\' 2"  (1.575 m)   Wt 179 lb (81.2 kg)  SpO2 95%   BMI 32.74 kg/m    Physical Exam Constitutional:      Appearance: Normal appearance.  HENT:     Head: Normocephalic and atraumatic.     Comments: Pharynx normal, eyes normal, ears normal, nose: Polyp noted in right nostril, left nostril bilateral lower turbinate moderately edematous and slightly erythematous with no drainage noted    Right Ear: Tympanic membrane, ear canal and external ear normal.     Left Ear: Tympanic membrane, ear canal and external ear normal.     Mouth/Throat:     Mouth: Mucous membranes are moist.     Pharynx: Oropharynx is clear.  Eyes:     Conjunctiva/sclera: Conjunctivae normal.  Cardiovascular:     Rate and Rhythm: Regular rhythm.      Heart sounds: Normal heart sounds.  Pulmonary:     Effort: Pulmonary effort is normal.     Breath sounds: Normal breath sounds.     Comments: Lungs clear to auscultation Musculoskeletal:     Cervical back: Neck supple.  Skin:    General: Skin is warm.  Neurological:     Mental Status: She is alert and oriented to person, place, and time.  Psychiatric:        Mood and Affect: Mood normal.        Behavior: Behavior normal.        Thought Content: Thought content normal.        Judgment: Judgment normal.     Diagnostics: FVC 1.94 L, FEV1 1.69 L.  Predicted FVC 2.25 L, FEV1 1.75 L.  Spirometry indicates normal ventilatory function  Assessment and Plan: 1. Seasonal and perennial allergic rhinitis   2. Nasal polyposis   3. Dyspnea on exertion     No orders of the defined types were placed in this encounter.   Patient Instructions  Seasonal and perennial allergic rhinitis Stop Qnasl 80 since this caused headaches Try a sample of Xhance 1-2 sprays each nostril twice a day. If this does not cause a headache call our office and we will send in a prescription Re-start montelukast 10 mg once a day. Patient cautioned that rarely some children/adults can experience behavioral changes after beginning montelukast. These side effects are rare, however, if you notice any change, notify the clinic and discontinue montelukast. May use an over the counter antihistamine once a day as needed for runny nose/itching such as Claritin, Zyrtec, Allegra, or Xyzal May use saline rinse as needed for nasal symptoms. Use prior to any medicated nasal sprays  Nasal Polyposis Treatment as above Pamphlet information given on Dupixent  Shortness of breath with exertion  Please let us know if this treatment plan is not working well for you Schedule a follow up appointment in 2 months   Return in about 2 months (around 09/21/2020), or if symptoms worsen or fail to improve.    Thank you for the  opportunity to care for this patient.  Please do not hesitate to contact me with questions.  Nehemiah Settle, FNP [facility]

## 2020-09-24 ENCOUNTER — Ambulatory Visit: Payer: BC Managed Care – PPO | Admitting: Allergy and Immunology

## 2020-10-13 ENCOUNTER — Emergency Department (HOSPITAL_BASED_OUTPATIENT_CLINIC_OR_DEPARTMENT_OTHER)
Admission: EM | Admit: 2020-10-13 | Discharge: 2020-10-13 | Disposition: A | Discharge status: Home / Self Care | Payer: BC Managed Care – PPO | Attending: Emergency Medicine | Admitting: Emergency Medicine

## 2020-10-13 ENCOUNTER — Encounter (HOSPITAL_BASED_OUTPATIENT_CLINIC_OR_DEPARTMENT_OTHER): Payer: Self-pay | Admitting: Emergency Medicine

## 2020-10-13 ENCOUNTER — Other Ambulatory Visit: Payer: Self-pay

## 2020-10-13 ENCOUNTER — Emergency Department (HOSPITAL_BASED_OUTPATIENT_CLINIC_OR_DEPARTMENT_OTHER): Payer: BC Managed Care – PPO

## 2020-10-13 DIAGNOSIS — R110 Nausea: Secondary | ICD-10-CM

## 2020-10-13 DIAGNOSIS — E119 Type 2 diabetes mellitus without complications: Secondary | ICD-10-CM | POA: Insufficient documentation

## 2020-10-13 DIAGNOSIS — R509 Fever, unspecified: Secondary | ICD-10-CM

## 2020-10-13 DIAGNOSIS — Z87891 Personal history of nicotine dependence: Secondary | ICD-10-CM | POA: Diagnosis not present

## 2020-10-13 DIAGNOSIS — R000 Tachycardia, unspecified: Secondary | ICD-10-CM | POA: Diagnosis not present

## 2020-10-13 DIAGNOSIS — U071 COVID-19: Secondary | ICD-10-CM | POA: Diagnosis not present

## 2020-10-13 LAB — CBC
HCT: 40.6 % (ref 36.0–46.0)
Hemoglobin: 13.4 g/dL (ref 12.0–15.0)
MCH: 26.5 pg (ref 26.0–34.0)
MCHC: 33 g/dL (ref 30.0–36.0)
MCV: 80.2 fL (ref 80.0–100.0)
Platelets: 214 10*3/uL (ref 150–400)
RBC: 5.06 MIL/uL (ref 3.87–5.11)
RDW: 13.9 % (ref 11.5–15.5)
WBC: 4.5 10*3/uL (ref 4.0–10.5)
nRBC: 0 % (ref 0.0–0.2)

## 2020-10-13 LAB — COMPREHENSIVE METABOLIC PANEL
ALT: 27 U/L (ref 0–44)
AST: 31 U/L (ref 15–41)
Albumin: 3.9 g/dL (ref 3.5–5.0)
Alkaline Phosphatase: 95 U/L (ref 38–126)
Anion gap: 9 (ref 5–15)
BUN: 13 mg/dL (ref 8–23)
CO2: 25 mmol/L (ref 22–32)
Calcium: 8.9 mg/dL (ref 8.9–10.3)
Chloride: 101 mmol/L (ref 98–111)
Creatinine, Ser: 0.71 mg/dL (ref 0.44–1.00)
GFR, Estimated: >60 mL/min (ref >60)
Glucose, Bld: 110 mg/dL — ABNORMAL HIGH (ref 70–99)
Potassium: 4 mmol/L (ref 3.5–5.1)
Sodium: 135 mmol/L (ref 135–145)
Total Bilirubin: 0.6 mg/dL (ref 0.3–1.2)
Total Protein: 8.2 g/dL — ABNORMAL HIGH (ref 6.5–8.1)

## 2020-10-13 LAB — URINALYSIS, ROUTINE W REFLEX MICROSCOPIC
Bilirubin Urine: NEGATIVE
Glucose, UA: NEGATIVE mg/dL
Hgb urine dipstick: NEGATIVE
Ketones, ur: 40 mg/dL — AB
Leukocytes,Ua: NEGATIVE
Nitrite: NEGATIVE
Protein, ur: NEGATIVE mg/dL
Specific Gravity, Urine: 1.025 (ref 1.005–1.030)
pH: 5.5 (ref 5.0–8.0)

## 2020-10-13 LAB — RESP PANEL BY RT-PCR (FLU A&B, COVID) ARPGX2
Influenza A by PCR: NEGATIVE
Influenza B by PCR: NEGATIVE
SARS Coronavirus 2 by RT PCR: POSITIVE — AB

## 2020-10-13 LAB — LIPASE, BLOOD: Lipase: 28 U/L (ref 11–51)

## 2020-10-13 MED ORDER — DEXAMETHASONE SODIUM PHOSPHATE 4 MG/ML IJ SOLN
4.0000 mg | Freq: Once | INTRAMUSCULAR | Status: AC
  Administered 2020-10-13: 4 mg via INTRAVENOUS
  Filled 2020-10-13: qty 1

## 2020-10-13 MED ORDER — ACETAMINOPHEN 500 MG PO TABS
1000.0000 mg | ORAL_TABLET | Freq: Once | ORAL | Status: AC | PRN
  Administered 2020-10-13: 975 mg via ORAL

## 2020-10-13 MED ORDER — DIPHENHYDRAMINE HCL 25 MG PO CAPS
25.0000 mg | ORAL_CAPSULE | ORAL | Status: AC
  Administered 2020-10-13: 25 mg via ORAL
  Filled 2020-10-13: qty 1

## 2020-10-13 MED ORDER — NIRMATRELVIR/RITONAVIR (PAXLOVID)TABLET: 3.0000 | ORAL_TABLET | Freq: Two times a day (BID) | ORAL | 0 refills | Status: AC

## 2020-10-13 MED ORDER — ONDANSETRON HCL 4 MG/2ML IJ SOLN
4.0000 mg | Freq: Once | INTRAMUSCULAR | Status: AC
  Administered 2020-10-13: 4 mg via INTRAVENOUS
  Filled 2020-10-13: qty 2

## 2020-10-13 MED ORDER — METOCLOPRAMIDE HCL 5 MG/ML IJ SOLN
10.0000 mg | Freq: Once | INTRAMUSCULAR | Status: AC
  Administered 2020-10-13: 10 mg via INTRAVENOUS
  Filled 2020-10-13: qty 2

## 2020-10-13 MED ORDER — DICYCLOMINE HCL 20 MG PO TABS
20.0000 mg | ORAL_TABLET | Freq: Two times a day (BID) | ORAL | 0 refills | Status: DC
Start: 2020-10-13 — End: 2022-01-22

## 2020-10-13 MED ORDER — ACETAMINOPHEN 325 MG PO TABS
650.0000 mg | ORAL_TABLET | Freq: Once | ORAL | Status: DC | PRN
  Filled 2020-10-13: qty 2

## 2020-10-13 MED ORDER — ONDANSETRON 4 MG PO TBDP: 4.0000 mg | ORAL_TABLET | Freq: Three times a day (TID) | ORAL | 0 refills | Status: DC | PRN

## 2020-10-13 NOTE — ED Notes (Signed)
PT resting O2 94% HR 93 PT ambulating O2 96% HR 104

## 2020-10-13 NOTE — ED Triage Notes (Signed)
Pt arrives pov with c/o abdominal pain that pt reports as being r/t emesis, HA, fever and N/V, x 2 days. Pt denies abdominal pain, denies diarrhea.

## 2020-10-13 NOTE — Discharge Instructions (Addendum)
You are diagnosed with COVID-19 today  Please take Tylenol and ibuprofen for pain and fever it is vitally important you take this consistently as this will help treat quite a few of your symptoms I have written down, she take below.  I prescribed zofran for nausea.  Please drink plenty of water.  I prescribed you Paxil elevated which is a antiviral medicine that you will be taking for the entire course prescribed - as we discussed this helps prevent people from requiring admission for their COVID-19.  Please use Tylenol or ibuprofen for pain.  You may use 600 mg ibuprofen every 6 hours or 1000 mg of Tylenol every 6 hours.  You may choose to alternate between the 2.  This would be most effective.  Not to exceed 4 g of Tylenol within 24 hours.  Not to exceed 3200 mg ibuprofen 24 hours.   I recommend taking vitamin D, vitamin C and zinc these can be bought over-the-counter.

## 2020-10-13 NOTE — ED Provider Notes (Signed)
[facility] EMERGENCY DEPARTMENT Provider Note   CSN: [id number] Arrival date & time: 10/13/20  1646     History No chief complaint on file.   Jacqueline Fischer is a 65 y.o. female.  HPI Patient is a 65 year old female with past medical history significant for anemia, arthritis, colitis, DM 2 diet-controlled, pneumonia,  Patient is presented to the ER today with complaints of feeling ill since yesterday evening.  She states that she has felt nausea had some generalized abdominal cramping, feeling fatigued, having subjective fevers at home which she did not measure with a thermometer, she endorses nausea has had 2 episodes of nonbloody nonbilious emesis.  Denies any diarrhea.  No chest pain or shortness of breath.  She states that her symptoms began yesterday she has not had any significant changes in her symptoms today.  States that she has had no appetite and has somewhat decreased sense of smell.  Denies any diarrhea including bright red blood PR.  Denies any blood in her emesis.  No other associate symptoms.  No aggravating mitigating factors.  She is taken no medications prior to arrival for her symptoms today.     Past Medical History:  Diagnosis Date   Anemia    Years ago   Arthritis    Colitis    1970's   Complication of anesthesia    pt states during nasal surgery she woke up during procedure   Diabetes mellitus without complication (HCC)    Migraine    Pneumonia    PONV (postoperative nausea and vomiting)    Recurrent upper respiratory infection (URI)     Patient Active Problem List   Diagnosis Date Noted   Other allergic rhinitis 04/08/2017   Nasal polyposis 04/08/2017   Cough 04/08/2017   Status post laparoscopic cholecystectomy 10/19/2014   Gallstones 10/19/2014    Past Surgical History:  Procedure Laterality Date   CATARACT EXTRACTION Right    CHOLECYSTECTOMY N/A 10/19/2014   Procedure: LAPAROSCOPIC CHOLECYSTECTOMY;  Surgeon: Jimmye Norman, MD;   Location: [facility] OR;  Service: General;  Laterality: N/A;   NASAL POLYP SURGERY     6 polyps removed   SINOSCOPY     TUBAL LIGATION       OB History     Gravida  2   Para  2   Term  2   Preterm      AB      Living  2      SAB      IAB      Ectopic      Multiple      Live Births  2           Family History  Problem Relation Age of Onset   Hypertension Mother    Diabetes type II Mother    Renal Disease Mother    Dementia Father    Congestive Heart Failure Father    Stroke Father    Hypertension Father    Renal Disease Brother    Diabetes type II Brother    Hypertension Sister    Diabetes Maternal Grandmother    Cancer Other     Social History   Tobacco Use   Smoking status: Former    Types: Cigarettes    Quit date: 10/17/1973    Years since quitting: 47.0   Smokeless tobacco: Never  Vaping Use   Vaping Use: Never used  Substance Use Topics   Alcohol use: No    Comment: occ  Drug use: No    Home Medications Prior to Admission medications   Medication Sig Start Date End Date Taking? Authorizing Provider  dicyclomine (BENTYL) 20 MG tablet Take 1 tablet (20 mg total) by mouth 2 (two) times daily. 10/13/20  Yes Dawsyn Ramsaran, Stevphen MeuseWylder S, PA  nirmatrelvir/ritonavir EUA (PAXLOVID) TABS Take 3 tablets by mouth 2 (two) times daily for 5 days. Patient GFR is >60. Take nirmatrelvir (150 mg) two tablets twice daily for 5 days and ritonavir (100 mg) one tablet twice daily for 5 days. 10/13/20 10/18/20 Yes Loring Liskey S, PA  ondansetron (ZOFRAN ODT) 4 MG disintegrating tablet Take 1 tablet (4 mg total) by mouth every 8 (eight) hours as needed for nausea or vomiting. 10/13/20  Yes Megan Presti S, PA  almotriptan (AXERT) 12.5 MG tablet Take 12.5 mg by mouth as needed for migraine. may repeat in 2 hours if needed    [provider]  Ascorbic Acid (VITAMIN C PO) Take by mouth.    [provider]  Beclomethasone Dipropionate (QNASL) 80 MCG/ACT AERS Use  one to two puffs in each nostril once daily. Patient not taking: Reported on 07/22/2020 07/28/18   Jessica PriestKozlow, Eric J, MD  Cholecalciferol (VITAMIN D PO) Take by mouth.    [provider]  montelukast (SINGULAIR) 10 MG tablet Take 1 tablet (10 mg total) by mouth at bedtime. 07/22/20   Nehemiah Settleale, Christine, FNP    Allergies    Hydrocodone, Oxycodone, and Tetracyclines & related  Review of Systems   Review of Systems  Constitutional:  Positive for fatigue. Negative for chills and fever.  HENT:  Positive for postnasal drip, rhinorrhea and sore throat. Negative for congestion.   Eyes:  Negative for pain and redness.  Respiratory:  Positive for cough. Negative for shortness of breath.   Cardiovascular:  Negative for chest pain and leg swelling.  Gastrointestinal:  Positive for abdominal pain, nausea and vomiting. Negative for diarrhea.  Endocrine: Negative for polyphagia.  Genitourinary:  Negative for dysuria.  Musculoskeletal:  Positive for myalgias.  Skin:  Negative for rash.  Neurological:  Positive for headaches. Negative for dizziness and syncope.  Psychiatric/Behavioral:  Negative for confusion.    Physical Exam Updated Vital Signs BP 103/78   Pulse 92   Temp 99.9 F (37.7 C) (Oral)   Resp 20   Ht 5\' 2"  (1.575 m)   Wt 78.9 kg   SpO2 95%   BMI 31.83 kg/m   Physical Exam Vitals and nursing note reviewed.  Constitutional:      General: She is not in acute distress.    Comments: Pleasant 65 year old female does not appear acutely toxic or significantly ill.  Speaking full sentences.  Able answer questions appropriately follow commands.  HENT:     Head: Normocephalic and atraumatic.     Nose: Nose normal.  Eyes:     General: No scleral icterus. Cardiovascular:     Rate and Rhythm: Normal rate and regular rhythm.     Pulses: Normal pulses.     Heart sounds: Normal heart sounds.  Pulmonary:     Effort: Pulmonary effort is normal. No respiratory distress.     Breath sounds:  No wheezing.     Comments: Lungs are clear to auscultation all fields Abdominal:     Palpations: Abdomen is soft.     Tenderness: There is no abdominal tenderness. There is no guarding or rebound.     Comments: Abdomen is soft, nontender, no guarding or rebound.  Musculoskeletal:  Cervical back: Normal range of motion.     Right lower leg: No edema.     Left lower leg: No edema.  Skin:    General: Skin is warm and dry.     Capillary Refill: Capillary refill takes less than 2 seconds.  Neurological:     Mental Status: She is alert. Mental status is at baseline.  Psychiatric:        Mood and Affect: Mood normal.        Behavior: Behavior normal.    ED Results / Procedures / Treatments   Labs (all labs ordered are listed, but only abnormal results are displayed) Labs Reviewed  RESP PANEL BY RT-PCR (FLU A&B, COVID) ARPGX2 - Abnormal; Notable for the following components:      Result Value   SARS Coronavirus 2 by RT PCR POSITIVE (*)    All other components within normal limits  COMPREHENSIVE METABOLIC PANEL - Abnormal; Notable for the following components:   Glucose, Bld 110 (*)    Total Protein 8.2 (*)    All other components within normal limits  URINALYSIS, ROUTINE W REFLEX MICROSCOPIC - Abnormal; Notable for the following components:   Ketones, ur 40 (*)    All other components within normal limits  LIPASE, BLOOD  CBC    EKG EKG Interpretation  Date/Time:  Sunday October 13 2020 17:10:00 EDT Ventricular Rate:  107 PR Interval:  156 QRS Duration: 84 QT Interval:  310 QTC Calculation: 413 R Axis:   42 Text Interpretation: Sinus tachycardia Possible Left atrial enlargement Septal infarct , age undetermined Abnormal ECG No significant change since last tracing Confirmed by Floyd, Dan ([id number]) on 10/13/2020 5:26:01 PM  Radiology DG Chest Port 1 View  Result Date: 10/13/2020 CLINICAL DATA:  Cough and fever. EXAM: PORTABLE CHEST 1 VIEW COMPARISON:  October 19, 2014 FINDINGS:  Tortuosity of the aorta. Cardiomediastinal silhouette is normal. Mediastinal contours appear intact. Streaky and nodular peribronchial airspace consolidation with lower lobe predominance. Osseous structures are without acute abnormality. Soft tissues are grossly normal. IMPRESSION: Streaky and nodular peribronchial airspace consolidation with lower lobe predominance. This may represent atypical/viral pneumonia or acute bronchitis. Electronically Signed   By: Dobrinka  Dimitrova M.D.   On: 10/13/2020 18:10    Procedures Procedures   Medications Ordered in ED Medications  metoCLOPramide (REGLAN) injection 10 mg (has no administration in time range)  dexamethasone (DECADRON) injection 4 mg (has no administration in time range)  diphenhydrAMINE (BENADRYL) capsule 25 mg (has no administration in time range)  ondansetron (ZOFRAN) injection 4 mg (4 mg Intravenous Given 10/13/20 1828)  acetaminophen (TYLENOL) tablet 1,000 mg (975 mg Oral Given 10/13/20 1754)    ED Course  I have reviewed the triage vital signs and the nursing notes.  Pertinent labs & imaging results that were available during my care of the patient were reviewed by me and considered in my medical decision making (see chart for details).  Clinical Course as of 10/13/20 1952  Sun Oct 13, 2020  1943 SpO2(!): 89 % Confirmed to be factitious.  Patient was not wearing pulse ox at this time.  Patient ambulated with no desaturation. [WF]    Clinical Course User Index [WF] Alia Parsley S, PA   MDM Rules/Calculators/A&P                          65  year old female found to have COVID-19.  Her symptoms began yesterday.  She is not on any statin  medications does not take any medications daily that are prescribed to her.  She is initial afebrile and mildly tachycardic.  Fever and tachycardia resolved with 1 dose of Tylenol.  On my reevaluation patient is complaining of headache which she states is her only symptom currently.  She has a  history of migraines and is worried that she is about to have 1.  We will treat with Decadron, Benadryl, Reglan.  Patient passed ambulatory pulse ox.  CMP unremarkable.  CBC without leukocytosis or anemia.  Lipase within limits.  Urinalysis unremarkable.  COVID test positive influenza negative.  Chest x-ray with no infiltrates there is evidence of atypical viral infection consistent with patient's COVID-19 status.  EKG unremarkable with no significant ST-T wave abnormalities of note.  Some mild left atrial enlargement which is not new from prior EKGs.  Mild tachycardia consistent with patient's fever.  Discussed treating with antivirals with patient.  She states that she would prefer to have these prescribed her.  Will prescribe antivirals along with Zofran for nausea and Bentyl for abdominal pain.  She will drink plenty of water.  Given return precautions.  She will follow-up with her PCP.  She is understanding of plan and agreeable to this.  Ambulatory time of discharge.   Final Clinical Impression(s) / ED Diagnoses Final diagnoses:  COVID-19  Fever, unspecified fever cause  Nausea    Rx / DC Orders ED Discharge Orders          Ordered    ondansetron (ZOFRAN ODT) 4 MG disintegrating tablet  Every 8 hours PRN        10/13/20 1943    nirmatrelvir/ritonavir EUA (PAXLOVID) TABS  2 times daily        10/13/20 1943    dicyclomine (BENTYL) 20 MG tablet  2 times daily        10/13/20 1943             Gailen Shelter, [place] 10/13/20 1952    Melene Plan, DO 10/13/20 2235

## 2021-06-09 DIAGNOSIS — M25561 Pain in right knee: Secondary | ICD-10-CM | POA: Diagnosis not present

## 2021-07-08 DIAGNOSIS — J342 Deviated nasal septum: Secondary | ICD-10-CM | POA: Diagnosis not present

## 2021-07-08 DIAGNOSIS — J343 Hypertrophy of nasal turbinates: Secondary | ICD-10-CM | POA: Diagnosis not present

## 2021-07-08 DIAGNOSIS — J338 Other polyp of sinus: Secondary | ICD-10-CM | POA: Diagnosis not present

## 2021-07-08 DIAGNOSIS — J324 Chronic pansinusitis: Secondary | ICD-10-CM | POA: Diagnosis not present

## 2021-07-28 DIAGNOSIS — J342 Deviated nasal septum: Secondary | ICD-10-CM | POA: Diagnosis not present

## 2021-07-28 DIAGNOSIS — J324 Chronic pansinusitis: Secondary | ICD-10-CM | POA: Diagnosis not present

## 2021-07-28 DIAGNOSIS — J338 Other polyp of sinus: Secondary | ICD-10-CM | POA: Diagnosis not present

## 2021-07-28 DIAGNOSIS — J343 Hypertrophy of nasal turbinates: Secondary | ICD-10-CM | POA: Diagnosis not present

## 2021-08-04 ENCOUNTER — Other Ambulatory Visit: Payer: Self-pay | Admitting: Otolaryngology

## 2021-08-04 DIAGNOSIS — J329 Chronic sinusitis, unspecified: Secondary | ICD-10-CM

## 2021-09-15 ENCOUNTER — Other Ambulatory Visit: Payer: BC Managed Care – PPO

## 2021-09-22 DIAGNOSIS — L299 Pruritus, unspecified: Secondary | ICD-10-CM | POA: Diagnosis not present

## 2021-09-22 DIAGNOSIS — L669 Cicatricial alopecia, unspecified: Secondary | ICD-10-CM | POA: Diagnosis not present

## 2021-10-06 DIAGNOSIS — L669 Cicatricial alopecia, unspecified: Secondary | ICD-10-CM | POA: Diagnosis not present

## 2021-10-17 ENCOUNTER — Other Ambulatory Visit: Payer: Self-pay

## 2021-10-17 ENCOUNTER — Encounter (HOSPITAL_BASED_OUTPATIENT_CLINIC_OR_DEPARTMENT_OTHER): Payer: Self-pay

## 2021-10-17 ENCOUNTER — Emergency Department (HOSPITAL_BASED_OUTPATIENT_CLINIC_OR_DEPARTMENT_OTHER)
Admission: EM | Admit: 2021-10-17 | Discharge: 2021-10-17 | Disposition: A | Discharge status: Home / Self Care | Payer: BC Managed Care – PPO | Attending: Emergency Medicine | Admitting: Emergency Medicine

## 2021-10-17 DIAGNOSIS — R110 Nausea: Secondary | ICD-10-CM | POA: Diagnosis not present

## 2021-10-17 DIAGNOSIS — R040 Epistaxis: Secondary | ICD-10-CM | POA: Insufficient documentation

## 2021-10-17 DIAGNOSIS — R42 Dizziness and giddiness: Secondary | ICD-10-CM | POA: Diagnosis not present

## 2021-10-17 LAB — URINALYSIS, ROUTINE W REFLEX MICROSCOPIC
Bilirubin Urine: NEGATIVE
Glucose, UA: NEGATIVE mg/dL
Hgb urine dipstick: NEGATIVE
Ketones, ur: NEGATIVE mg/dL
Leukocytes,Ua: NEGATIVE
Nitrite: NEGATIVE
Protein, ur: NEGATIVE mg/dL
Specific Gravity, Urine: <=1.005 (ref 1.005–1.030)
pH: 5 (ref 5.0–8.0)

## 2021-10-17 LAB — CBC
HCT: 41.5 % (ref 36.0–46.0)
Hemoglobin: 13.8 g/dL (ref 12.0–15.0)
MCH: 26.5 pg (ref 26.0–34.0)
MCHC: 33.3 g/dL (ref 30.0–36.0)
MCV: 79.8 fL — ABNORMAL LOW (ref 80.0–100.0)
Platelets: 322 K/uL (ref 150–400)
RBC: 5.2 MIL/uL — ABNORMAL HIGH (ref 3.87–5.11)
RDW: 13.5 % (ref 11.5–15.5)
WBC: 4.6 K/uL (ref 4.0–10.5)
nRBC: 0 % (ref 0.0–0.2)

## 2021-10-17 LAB — BASIC METABOLIC PANEL WITH GFR
Anion gap: 7 (ref 5–15)
BUN: 13 mg/dL (ref 8–23)
CO2: 22 mmol/L (ref 22–32)
Calcium: 9.4 mg/dL (ref 8.9–10.3)
Chloride: 109 mmol/L (ref 98–111)
Creatinine, Ser: 0.71 mg/dL (ref 0.44–1.00)
GFR, Estimated: >60 mL/min
Glucose, Bld: 122 mg/dL — ABNORMAL HIGH (ref 70–99)
Potassium: 4 mmol/L (ref 3.5–5.1)
Sodium: 138 mmol/L (ref 135–145)

## 2021-10-17 LAB — CBG MONITORING, ED: Glucose-Capillary: 126 mg/dL — ABNORMAL HIGH (ref 70–99)

## 2021-10-17 MED ORDER — ONDANSETRON 4 MG PO TBDP
4.0000 mg | ORAL_TABLET | Freq: Once | ORAL | Status: AC
  Administered 2021-10-17: 4 mg via ORAL
  Filled 2021-10-17: qty 1

## 2021-10-17 MED ORDER — ONDANSETRON HCL 4 MG PO TABS: 4.0000 mg | ORAL_TABLET | Freq: Four times a day (QID) | ORAL | 0 refills | Status: DC

## 2021-10-17 NOTE — ED Provider Notes (Signed)
[facility] EMERGENCY DEPARTMENT Provider Note   CSN: [id number] Arrival date & time: 10/17/21  7106     History  Chief Complaint  Patient presents with   Dizziness   Epistaxis   Nausea    Jacqueline Fischer is a 66 y.o. female with history of diabetes who presents to the emergency department for evaluation of lightheadedness that started this morning.  Patient states that she first noticed when she was getting into the shower that she felt a little "woozy" with some associated nausea.  Symptoms resolve while at rest.  She states it is worsened when she is bending over.  She also believes that she may be developing an early sinus infection due to some sinus discomfort.  She also notes that she has not drink a lot of water today.  No treatment prior to arrival.  She states that she blew her nose 1 time today noticed a small amount of blood from her left nare.  It is not actively bleeding.  She denies fever, chills, slurred speech, unilateral weakness, numbness, tingling, chest pain, shortness of breath.   Dizziness Associated symptoms: no chest pain, no diarrhea, no nausea and no vomiting   Epistaxis Associated symptoms: dizziness   Associated symptoms: no cough and no fever        Home Medications Prior to Admission medications   Medication Sig Start Date End Date Taking? Authorizing Provider  ondansetron (ZOFRAN) 4 MG tablet Take 1 tablet (4 mg total) by mouth every 6 (six) hours. 10/17/21  Yes Janell Quiet, PA-C  almotriptan (AXERT) 12.5 MG tablet Take 12.5 mg by mouth as needed for migraine. may repeat in 2 hours if needed    [provider]  Ascorbic Acid (VITAMIN C PO) Take by mouth.    [provider]  Beclomethasone Dipropionate (QNASL) 80 MCG/ACT AERS Use one to two puffs in each nostril once daily. Patient not taking: Reported on 07/22/2020 07/28/18   Jessica Priest, MD  Cholecalciferol (VITAMIN D PO) Take by mouth.    [provider]   dicyclomine (BENTYL) 20 MG tablet Take 1 tablet (20 mg total) by mouth 2 (two) times daily. 10/13/20   Fondaw, Rodrigo Ran, PA  montelukast (SINGULAIR) 10 MG tablet Take 1 tablet (10 mg total) by mouth at bedtime. 07/22/20   Nehemiah Settle, FNP  ondansetron (ZOFRAN ODT) 4 MG disintegrating tablet Take 1 tablet (4 mg total) by mouth every 8 (eight) hours as needed for nausea or vomiting. 10/13/20   Gailen Shelter, PA      Allergies    Hydrocodone, Oxycodone, and Tetracyclines & related    Review of Systems   Review of Systems  Constitutional:  Negative for fever.  HENT:  Positive for nosebleeds.   Respiratory:  Negative for cough.   Cardiovascular:  Negative for chest pain.  Gastrointestinal:  Negative for abdominal pain, diarrhea, nausea and vomiting.  Genitourinary:  Negative for dysuria.  Musculoskeletal:  Negative for back pain.  Skin:  Negative for rash.  Neurological:  Positive for dizziness.    Physical Exam Updated Vital Signs BP (!) 158/82 (BP Location: Right Arm)   Pulse 68   Temp 98.5 F (36.9 C) (Oral)   Resp 16   Ht 5\' 2"  (1.575 m)   Wt 77.1 kg   SpO2 97%   BMI 31.09 kg/m  Physical Exam Vitals and nursing note reviewed.  Constitutional:      General: She is not in acute distress.  Appearance: She is not ill-appearing.  HENT:     Head: Atraumatic.     Nose: Nose normal. No congestion or rhinorrhea.     Comments: Bilateral nares clear without bleeding Eyes:     Extraocular Movements: Extraocular movements intact.     Conjunctiva/sclera: Conjunctivae normal.     Pupils: Pupils are equal, round, and reactive to light.  Cardiovascular:     Rate and Rhythm: Normal rate and regular rhythm.     Pulses: Normal pulses.     Heart sounds: No murmur heard. Pulmonary:     Effort: Pulmonary effort is normal. No respiratory distress.     Breath sounds: Normal breath sounds.  Abdominal:     General: Abdomen is flat. There is no distension.     Palpations: Abdomen is  soft.     Tenderness: There is no abdominal tenderness.  Musculoskeletal:        General: Normal range of motion.     Cervical back: Normal range of motion.  Skin:    General: Skin is warm and dry.     Capillary Refill: Capillary refill takes less than 2 seconds.  Neurological:     General: No focal deficit present.     Mental Status: She is alert.     Comments: Speech is clear, able to follow commands  Normal strength in upper and lower extremities bilaterally including dorsiflexion and plantar flexion, strong and equal grip strength Subjective sensation intact Moves extremities without ataxia, coordination intact  No pronator drift    Psychiatric:        Mood and Affect: Mood normal.     ED Results / Procedures / Treatments   Labs (all labs ordered are listed, but only abnormal results are displayed) Labs Reviewed  BASIC METABOLIC PANEL - Abnormal; Notable for the following components:      Result Value   Glucose, Bld 122 (*)    All other components within normal limits  CBC - Abnormal; Notable for the following components:   RBC 5.20 (*)    MCV 79.8 (*)    All other components within normal limits  URINALYSIS, ROUTINE W REFLEX MICROSCOPIC - Abnormal; Notable for the following components:   Color, Urine STRAW (*)    All other components within normal limits  CBG MONITORING, ED - Abnormal; Notable for the following components:   Glucose-Capillary 126 (*)    All other components within normal limits    EKG EKG Interpretation  Date/Time:  Friday October 17 2021 10:05:26 EDT Ventricular Rate:  74 PR Interval:  160 QRS Duration: 88 QT Interval:  370 QTC Calculation: 410 R Axis:   21 Text Interpretation: Normal sinus rhythm Anterior infarct , age undetermined Abnormal ECG When compared with ECG of 13-Oct-2020 17:10, rate is slower now Confirmed by Meridee Score [phone]) on 10/17/2021 10:09:20 AM  Radiology No results found.  Procedures Procedures     Medications Ordered in ED Medications  ondansetron (ZOFRAN-ODT) disintegrating tablet 4 mg (has no administration in time range)    ED Course/ Medical Decision Making/ A&P                           Medical Decision Making Amount and/or Complexity of Data Reviewed Labs: ordered.  Risk Prescription drug management.   66 year old female presents to the emergency department for evaluation of lightheadedness and some nausea.  Differentials include orthostatic, CVA, viral infection, metabolic abnormality, electrolyte abnormality.  Vitals are without  significant abnormality.  On exam, patient is well-appearing and pleasant.  Physical exam was overall benign.  Neuro exam normal.  I ordered and interpreted labs including BMP, CBC and urinalysis which were all normal.  Overall, patient's work-up and physical exam are very reassuring and a do not suspect an emergent cause for her symptoms.  It is possible given that she is complaining of sinus discomfort, she may be developing a viral infection.  Advised Claritin or Zyrtec along with increased water intake to help with symptoms.  Return precautions discussed.  Discharged home in good condition.  Final Clinical Impression(s) / ED Diagnoses Final diagnoses:  Lightheadedness    Rx / DC Orders ED Discharge Orders          Ordered    ondansetron (ZOFRAN) 4 MG tablet  Every 6 hours        10/17/21 1200              Janell Quiet, [place] 10/17/21 1213    Terrilee Files, MD 10/17/21 1733

## 2021-10-17 NOTE — Discharge Instructions (Signed)
Your exam and your labs today were very reassuring.  I suspect that you are feeling lightheaded due to not drinking enough water today and possibly developing some sinus congestion.  You can take an allergy medication such as Claritin or Zyrtec and please drink plenty of water.  I hope you enjoy your trip to [place]!

## 2021-10-17 NOTE — ED Triage Notes (Signed)
Patient states she woke up this am light headed - states she started having a nose bleed from her left nare - now has subsided. States she is having mild nausea as well.

## 2021-10-24 DIAGNOSIS — R42 Dizziness and giddiness: Secondary | ICD-10-CM | POA: Diagnosis not present

## 2021-10-24 DIAGNOSIS — J0111 Acute recurrent frontal sinusitis: Secondary | ICD-10-CM | POA: Diagnosis not present

## 2021-11-03 ENCOUNTER — Ambulatory Visit
Admission: RE | Admit: 2021-11-03 | Discharge: 2021-11-03 | Disposition: A | Discharge status: Home / Self Care | Payer: BC Managed Care – PPO | Source: Ambulatory Visit | Attending: Otolaryngology | Admitting: Otolaryngology

## 2021-11-03 DIAGNOSIS — J341 Cyst and mucocele of nose and nasal sinus: Secondary | ICD-10-CM | POA: Diagnosis not present

## 2021-11-03 DIAGNOSIS — J3489 Other specified disorders of nose and nasal sinuses: Secondary | ICD-10-CM | POA: Diagnosis not present

## 2021-11-03 DIAGNOSIS — J329 Chronic sinusitis, unspecified: Secondary | ICD-10-CM

## 2021-11-18 DIAGNOSIS — Z20828 Contact with and (suspected) exposure to other viral communicable diseases: Secondary | ICD-10-CM | POA: Diagnosis not present

## 2021-11-24 DIAGNOSIS — J321 Chronic frontal sinusitis: Secondary | ICD-10-CM | POA: Diagnosis not present

## 2021-11-24 DIAGNOSIS — J320 Chronic maxillary sinusitis: Secondary | ICD-10-CM | POA: Diagnosis not present

## 2021-11-24 DIAGNOSIS — J322 Chronic ethmoidal sinusitis: Secondary | ICD-10-CM | POA: Diagnosis not present

## 2021-11-24 DIAGNOSIS — J338 Other polyp of sinus: Secondary | ICD-10-CM | POA: Diagnosis not present

## 2021-12-08 DIAGNOSIS — Z0000 Encounter for general adult medical examination without abnormal findings: Secondary | ICD-10-CM | POA: Diagnosis not present

## 2021-12-08 DIAGNOSIS — J339 Nasal polyp, unspecified: Secondary | ICD-10-CM | POA: Diagnosis not present

## 2021-12-08 DIAGNOSIS — E119 Type 2 diabetes mellitus without complications: Secondary | ICD-10-CM | POA: Diagnosis not present

## 2021-12-08 DIAGNOSIS — I10 Essential (primary) hypertension: Secondary | ICD-10-CM | POA: Diagnosis not present

## 2021-12-15 ENCOUNTER — Telehealth: Payer: Self-pay

## 2021-12-15 NOTE — Telephone Encounter (Signed)
[facility] call from pt on 12/12/21.  Returned call this am. Jacqueline Fischer about PREP and wanted to know how to get referred Explained program to pt.  Explained how MD could refer either thru Epic or just call my cell.  Pt will f/u with MD office.

## 2021-12-23 ENCOUNTER — Other Ambulatory Visit: Payer: Self-pay | Admitting: Otolaryngology

## 2022-01-21 DIAGNOSIS — Z23 Encounter for immunization: Secondary | ICD-10-CM | POA: Diagnosis not present

## 2022-01-22 ENCOUNTER — Other Ambulatory Visit: Payer: Self-pay

## 2022-01-22 ENCOUNTER — Encounter (HOSPITAL_BASED_OUTPATIENT_CLINIC_OR_DEPARTMENT_OTHER): Payer: Self-pay | Admitting: Otolaryngology

## 2022-02-02 ENCOUNTER — Ambulatory Visit (HOSPITAL_BASED_OUTPATIENT_CLINIC_OR_DEPARTMENT_OTHER): Payer: BC Managed Care – PPO | Admitting: Anesthesiology

## 2022-02-02 ENCOUNTER — Other Ambulatory Visit: Payer: Self-pay

## 2022-02-02 ENCOUNTER — Encounter (HOSPITAL_BASED_OUTPATIENT_CLINIC_OR_DEPARTMENT_OTHER)
Admission: RE | Disposition: A | Discharge status: Home / Self Care | Payer: Self-pay | Source: Home / Self Care | Attending: Otolaryngology

## 2022-02-02 ENCOUNTER — Ambulatory Visit (HOSPITAL_BASED_OUTPATIENT_CLINIC_OR_DEPARTMENT_OTHER)
Admission: RE | Admit: 2022-02-02 | Discharge: 2022-02-03 | Disposition: A | Discharge status: Home / Self Care | Payer: BC Managed Care – PPO | Attending: Otolaryngology | Admitting: Otolaryngology

## 2022-02-02 ENCOUNTER — Encounter (HOSPITAL_BASED_OUTPATIENT_CLINIC_OR_DEPARTMENT_OTHER): Payer: Self-pay | Admitting: Otolaryngology

## 2022-02-02 DIAGNOSIS — J343 Hypertrophy of nasal turbinates: Secondary | ICD-10-CM | POA: Insufficient documentation

## 2022-02-02 DIAGNOSIS — J322 Chronic ethmoidal sinusitis: Secondary | ICD-10-CM | POA: Diagnosis not present

## 2022-02-02 DIAGNOSIS — J321 Chronic frontal sinusitis: Secondary | ICD-10-CM | POA: Diagnosis not present

## 2022-02-02 DIAGNOSIS — J342 Deviated nasal septum: Secondary | ICD-10-CM | POA: Insufficient documentation

## 2022-02-02 DIAGNOSIS — Z01818 Encounter for other preprocedural examination: Secondary | ICD-10-CM

## 2022-02-02 DIAGNOSIS — J0100 Acute maxillary sinusitis, unspecified: Secondary | ICD-10-CM | POA: Diagnosis not present

## 2022-02-02 DIAGNOSIS — J3489 Other specified disorders of nose and nasal sinuses: Secondary | ICD-10-CM | POA: Diagnosis not present

## 2022-02-02 DIAGNOSIS — J0120 Acute ethmoidal sinusitis, unspecified: Secondary | ICD-10-CM | POA: Diagnosis not present

## 2022-02-02 DIAGNOSIS — Z9889 Other specified postprocedural states: Secondary | ICD-10-CM

## 2022-02-02 DIAGNOSIS — E119 Type 2 diabetes mellitus without complications: Secondary | ICD-10-CM | POA: Diagnosis not present

## 2022-02-02 DIAGNOSIS — Z87891 Personal history of nicotine dependence: Secondary | ICD-10-CM | POA: Diagnosis not present

## 2022-02-02 DIAGNOSIS — J339 Nasal polyp, unspecified: Secondary | ICD-10-CM | POA: Diagnosis not present

## 2022-02-02 DIAGNOSIS — J338 Other polyp of sinus: Secondary | ICD-10-CM | POA: Diagnosis not present

## 2022-02-02 DIAGNOSIS — K047 Periapical abscess without sinus: Secondary | ICD-10-CM | POA: Insufficient documentation

## 2022-02-02 DIAGNOSIS — J328 Other chronic sinusitis: Secondary | ICD-10-CM | POA: Diagnosis not present

## 2022-02-02 DIAGNOSIS — J320 Chronic maxillary sinusitis: Secondary | ICD-10-CM | POA: Diagnosis not present

## 2022-02-02 HISTORY — PX: MAXILLARY ANTROSTOMY: SHX2003

## 2022-02-02 HISTORY — PX: NASAL SEPTOPLASTY W/ TURBINOPLASTY: SHX2070

## 2022-02-02 HISTORY — PX: SINUS ENDO WITH FUSION: SHX5329

## 2022-02-02 HISTORY — PX: ETHMOIDECTOMY: SHX5197

## 2022-02-02 SURGERY — SURGERY, PARANASAL SINUS, ENDOSCOPIC, WITH NASAL SEPTOPLASTY, TURBINOPLASTY, AND MAXILLARY SINUSOTOMY
Anesthesia: General | Site: Nose | Laterality: Right

## 2022-02-02 MED ORDER — FENTANYL CITRATE (PF) 100 MCG/2ML IJ SOLN
INTRAMUSCULAR | Status: AC
  Filled 2022-02-02: qty 2

## 2022-02-02 MED ORDER — PROPOFOL 500 MG/50ML IV EMUL
INTRAVENOUS | Status: DC | PRN
  Administered 2022-02-02: 50 ug/kg/min via INTRAVENOUS

## 2022-02-02 MED ORDER — MIDAZOLAM HCL 2 MG/2ML IJ SOLN
INTRAMUSCULAR | Status: AC
  Filled 2022-02-02: qty 2

## 2022-02-02 MED ORDER — ACETAMINOPHEN 500 MG PO TABS
ORAL_TABLET | ORAL | Status: AC
  Filled 2022-02-02: qty 2

## 2022-02-02 MED ORDER — PHENYLEPHRINE 80 MCG/ML (10ML) SYRINGE FOR IV PUSH (FOR BLOOD PRESSURE SUPPORT)
PREFILLED_SYRINGE | INTRAVENOUS | Status: AC
  Filled 2022-02-02: qty 10

## 2022-02-02 MED ORDER — FENTANYL CITRATE (PF) 100 MCG/2ML IJ SOLN
INTRAMUSCULAR | Status: DC | PRN
  Administered 2022-02-02 (×2): 50 ug via INTRAVENOUS

## 2022-02-02 MED ORDER — ROCURONIUM BROMIDE 10 MG/ML (PF) SYRINGE
PREFILLED_SYRINGE | INTRAVENOUS | Status: AC
  Filled 2022-02-02: qty 10

## 2022-02-02 MED ORDER — KETOROLAC TROMETHAMINE 15 MG/ML IJ SOLN
15.0000 mg | Freq: Once | INTRAMUSCULAR | Status: AC | PRN
  Administered 2022-02-02: 15 mg via INTRAVENOUS

## 2022-02-02 MED ORDER — MIDAZOLAM HCL 5 MG/5ML IJ SOLN
INTRAMUSCULAR | Status: DC | PRN
  Administered 2022-02-02: 2 mg via INTRAVENOUS

## 2022-02-02 MED ORDER — LIDOCAINE HCL (CARDIAC) PF 100 MG/5ML IV SOSY
PREFILLED_SYRINGE | INTRAVENOUS | Status: DC | PRN
  Administered 2022-02-02: 60 mg via INTRAVENOUS

## 2022-02-02 MED ORDER — ONDANSETRON HCL 4 MG/2ML IJ SOLN
INTRAMUSCULAR | Status: AC
  Filled 2022-02-02: qty 2

## 2022-02-02 MED ORDER — LACTATED RINGERS IV SOLN: INTRAVENOUS | Status: DC

## 2022-02-02 MED ORDER — ACETAMINOPHEN 325 MG RE SUPP: 650.0000 mg | RECTAL | Status: DC | PRN

## 2022-02-02 MED ORDER — ONDANSETRON HCL 4 MG PO TABS: 4.0000 mg | ORAL_TABLET | ORAL | Status: DC | PRN

## 2022-02-02 MED ORDER — ACETAMINOPHEN 500 MG PO TABS
1000.0000 mg | ORAL_TABLET | Freq: Once | ORAL | Status: AC
  Administered 2022-02-02: 1000 mg via ORAL

## 2022-02-02 MED ORDER — AMOXICILLIN 875 MG PO TABS: 875.0000 mg | ORAL_TABLET | Freq: Two times a day (BID) | ORAL | 0 refills | Status: AC

## 2022-02-02 MED ORDER — OXYMETAZOLINE HCL 0.05 % NA SOLN
NASAL | Status: DC | PRN
  Administered 2022-02-02: 1 via TOPICAL

## 2022-02-02 MED ORDER — MORPHINE SULFATE (PF) 4 MG/ML IV SOLN: 2.0000 mg | INTRAVENOUS | Status: DC | PRN

## 2022-02-02 MED ORDER — LIDOCAINE-EPINEPHRINE 1 %-1:100000 IJ SOLN
INTRAMUSCULAR | Status: DC | PRN
  Administered 2022-02-02: 4 mL

## 2022-02-02 MED ORDER — FENTANYL CITRATE (PF) 100 MCG/2ML IJ SOLN
25.0000 ug | INTRAMUSCULAR | Status: DC | PRN
  Administered 2022-02-02 (×3): 50 ug via INTRAVENOUS

## 2022-02-02 MED ORDER — ONDANSETRON HCL 4 MG/2ML IJ SOLN
INTRAMUSCULAR | Status: DC | PRN
  Administered 2022-02-02: 4 mg via INTRAVENOUS

## 2022-02-02 MED ORDER — DEXAMETHASONE SODIUM PHOSPHATE 10 MG/ML IJ SOLN
INTRAMUSCULAR | Status: AC
  Filled 2022-02-02: qty 1

## 2022-02-02 MED ORDER — SUGAMMADEX SODIUM 200 MG/2ML IV SOLN
INTRAVENOUS | Status: DC | PRN
  Administered 2022-02-02: 154.2 mg via INTRAVENOUS

## 2022-02-02 MED ORDER — KETOROLAC TROMETHAMINE 30 MG/ML IJ SOLN
INTRAMUSCULAR | Status: AC
  Filled 2022-02-02: qty 1

## 2022-02-02 MED ORDER — AMISULPRIDE (ANTIEMETIC) 5 MG/2ML IV SOLN: 10.0000 mg | Freq: Once | INTRAVENOUS | Status: DC | PRN

## 2022-02-02 MED ORDER — OXYCODONE-ACETAMINOPHEN 5-325 MG PO TABS: 1.0000 | ORAL_TABLET | Freq: Four times a day (QID) | ORAL | 0 refills | Status: AC | PRN

## 2022-02-02 MED ORDER — MUPIROCIN 2 % EX OINT
TOPICAL_OINTMENT | CUTANEOUS | Status: DC | PRN
  Administered 2022-02-02: 1 via TOPICAL

## 2022-02-02 MED ORDER — PROPOFOL 10 MG/ML IV BOLUS
INTRAVENOUS | Status: DC | PRN
  Administered 2022-02-02 (×2): 50 mg via INTRAVENOUS
  Administered 2022-02-02: 100 mg via INTRAVENOUS

## 2022-02-02 MED ORDER — LIDOCAINE 2% (20 MG/ML) 5 ML SYRINGE
INTRAMUSCULAR | Status: AC
  Filled 2022-02-02: qty 5

## 2022-02-02 MED ORDER — ONDANSETRON HCL 4 MG/2ML IJ SOLN: 4.0000 mg | INTRAMUSCULAR | Status: DC | PRN

## 2022-02-02 MED ORDER — CEFAZOLIN SODIUM-DEXTROSE 2-3 GM-%(50ML) IV SOLR
INTRAVENOUS | Status: DC | PRN
  Administered 2022-02-02: 2 g via INTRAVENOUS

## 2022-02-02 MED ORDER — ONDANSETRON HCL 4 MG/2ML IJ SOLN: 4.0000 mg | Freq: Once | INTRAMUSCULAR | Status: DC | PRN

## 2022-02-02 MED ORDER — ROCURONIUM BROMIDE 100 MG/10ML IV SOLN
INTRAVENOUS | Status: DC | PRN
  Administered 2022-02-02: 40 mg via INTRAVENOUS

## 2022-02-02 MED ORDER — KCL IN DEXTROSE-NACL 20-5-0.45 MEQ/L-%-% IV SOLN
INTRAVENOUS | Status: DC
  Filled 2022-02-02: qty 1000

## 2022-02-02 MED ORDER — CEFAZOLIN SODIUM 1 G IJ SOLR
INTRAMUSCULAR | Status: AC
  Filled 2022-02-02: qty 20

## 2022-02-02 MED ORDER — PROPOFOL 500 MG/50ML IV EMUL
INTRAVENOUS | Status: AC
  Filled 2022-02-02: qty 50

## 2022-02-02 MED ORDER — PHENYLEPHRINE HCL (PRESSORS) 10 MG/ML IV SOLN
INTRAVENOUS | Status: DC | PRN
  Administered 2022-02-02 (×2): 80 ug via INTRAVENOUS

## 2022-02-02 MED ORDER — DEXAMETHASONE SODIUM PHOSPHATE 4 MG/ML IJ SOLN
INTRAMUSCULAR | Status: DC | PRN
  Administered 2022-02-02: 10 mg via INTRAVENOUS

## 2022-02-02 MED ORDER — ACETAMINOPHEN-CODEINE 300-30 MG PO TABS
1.0000 | ORAL_TABLET | ORAL | Status: DC | PRN
  Administered 2022-02-02: 2 via ORAL
  Administered 2022-02-03: 1 via ORAL
  Filled 2022-02-02: qty 2
  Filled 2022-02-02: qty 1

## 2022-02-02 MED ORDER — ACETAMINOPHEN 160 MG/5ML PO SOLN: 650.0000 mg | ORAL | Status: DC | PRN

## 2022-02-02 SURGICAL SUPPLY — 53 items
ATTRACTOMAT 16X20 MAGNETIC DRP (DRAPES) IMPLANT
BLADE RAD40 ROTATE 4M 4 5PK (BLADE) IMPLANT
BLADE RAD60 ROTATE M4 4 5PK (BLADE) IMPLANT
BLADE ROTATE RAD 12 4 M4 (BLADE) IMPLANT
BLADE ROTATE RAD 40 4 M4 (BLADE) IMPLANT
BLADE ROTATE TRICUT 4X13 M4 (BLADE) ×3 IMPLANT
BLADE TRICUT ROTATE M4 4 5PK (BLADE) IMPLANT
BUR HS RAD FRONTAL 3 (BURR) IMPLANT
CANISTER SUC SOCK COL 7IN (MISCELLANEOUS) ×3 IMPLANT
CANISTER SUCT 1200ML W/VALVE (MISCELLANEOUS) ×6 IMPLANT
COAGULATOR SUCT 8FR VV (MISCELLANEOUS) ×3 IMPLANT
DEFOGGER MIRROR 1QT (MISCELLANEOUS) ×3 IMPLANT
DRSG NASAL KENNEDY LMNT 8CM (GAUZE/BANDAGES/DRESSINGS) IMPLANT
DRSG NASOPORE 8CM (GAUZE/BANDAGES/DRESSINGS) IMPLANT
DRSG TELFA 3X8 NADH STRL (GAUZE/BANDAGES/DRESSINGS) IMPLANT
ELECT REM PT RETURN 9FT ADLT (ELECTROSURGICAL) ×2
ELECTRODE REM PT RTRN 9FT ADLT (ELECTROSURGICAL) ×3 IMPLANT
GLOVE BIO SURGEON STRL SZ7.5 (GLOVE) ×3 IMPLANT
GLOVE BIOGEL PI IND STRL 7.0 (GLOVE) IMPLANT
GLOVE SURG SS PI 6.5 STRL IVOR (GLOVE) IMPLANT
GOWN STRL REUS W/ TWL LRG LVL3 (GOWN DISPOSABLE) ×6 IMPLANT
GOWN STRL REUS W/TWL LRG LVL3 (GOWN DISPOSABLE) ×6
HEMOSTAT SURGICEL 2X14 (HEMOSTASIS) IMPLANT
IV NS 500ML (IV SOLUTION) ×2
IV NS 500ML BAXH (IV SOLUTION) ×3 IMPLANT
NDL HYPO 25X1 1.5 SAFETY (NEEDLE) ×3 IMPLANT
NDL SPNL 25GX3.5 QUINCKE BL (NEEDLE) IMPLANT
NEEDLE HYPO 25X1 1.5 SAFETY (NEEDLE) ×2 IMPLANT
NEEDLE SPNL 25GX3.5 QUINCKE BL (NEEDLE) IMPLANT
NS IRRIG 1000ML POUR BTL (IV SOLUTION) ×3 IMPLANT
PACK BASIN DAY SURGERY FS (CUSTOM PROCEDURE TRAY) ×3 IMPLANT
PACK ENT DAY SURGERY (CUSTOM PROCEDURE TRAY) ×3 IMPLANT
SLEEVE SCD COMPRESS KNEE MED (STOCKING) ×3 IMPLANT
SPIKE FLUID TRANSFER (MISCELLANEOUS) IMPLANT
SPLINT NASAL AIRWAY SILICONE (MISCELLANEOUS) ×3 IMPLANT
SPONGE GAUZE 2X2 8PLY STRL LF (GAUZE/BANDAGES/DRESSINGS) ×3 IMPLANT
SPONGE NEURO XRAY DETECT 1X3 (DISPOSABLE) ×3 IMPLANT
SUCTION FRAZIER HANDLE 10FR (MISCELLANEOUS)
SUCTION TUBE FRAZIER 10FR DISP (MISCELLANEOUS) IMPLANT
SUT CHROMIC 4 0 P 3 18 (SUTURE) ×3 IMPLANT
SUT PLAIN 4 0 ~~LOC~~ 1 (SUTURE) ×3 IMPLANT
SUT PROLENE 3 0 PS 2 (SUTURE) ×3 IMPLANT
SUT VIC AB 4-0 P-3 18XBRD (SUTURE) IMPLANT
SUT VIC AB 4-0 P3 18 (SUTURE)
SYR 50ML LL SCALE MARK (SYRINGE) IMPLANT
TOWEL GREEN STERILE FF (TOWEL DISPOSABLE) ×3 IMPLANT
TRACKER ENT INSTRUMENT (MISCELLANEOUS) ×3 IMPLANT
TRACKER ENT PATIENT (MISCELLANEOUS) ×3 IMPLANT
TUBE CONNECTING 20X1/4 (TUBING) ×3 IMPLANT
TUBE SALEM SUMP 12R W/ARV (TUBING) IMPLANT
TUBE SALEM SUMP 16 FR W/ARV (TUBING) ×3 IMPLANT
TUBING STRAIGHTSHOT EPS 5PK (TUBING) ×3 IMPLANT
YANKAUER SUCT BULB TIP NO VENT (SUCTIONS) ×3 IMPLANT

## 2022-02-02 NOTE — Discharge Instructions (Addendum)
POSTOPERATIVE INSTRUCTIONS FOR PATIENTS HAVING NASAL OR SINUS OPERATIONS ACTIVITY: Restrict activity at home for the first two days, resting as much as possible. Light activity is best. You may usually return to work within a week. You should refrain from nose blowing, strenuous activity, or heavy lifting greater than 20lbs for a total of one week after your operation.  If sneezing cannot be avoided, sneeze with your mouth open. DISCOMFORT: You may experience a dull headache and pressure along with nasal congestion and discharge. These symptoms may be worse during the first week after the operation but may last as long as two to four weeks.  Please take Tylenol or the pain medication that has been prescribed for you. Do not take aspirin or aspirin containing medications since they may cause bleeding.  You may experience symptoms of post nasal drainage, nasal congestion, headaches and fatigue for two or three months after your operation.  BLEEDING: You may have some blood tinged nasal drainage for approximately two weeks after the operation.  The discharge will be worse for the first week.  Please call our office at [phone] or go to the nearest hospital emergency room if you experience any of the following: heavy, bright red blood from your nose or mouth that lasts longer than 15 minutes or coughing up or vomiting bright red blood or blood clots. GENERAL CONSIDERATIONS: A gauze dressing will be placed on your upper lip to absorb any drainage after the operation. You may need to change this several times a day.  If you do not have very much drainage, you may remove the dressing.  Remember that you may gently wipe your nose with a tissue and sniff in, but DO NOT blow your nose. Please keep all of your postoperative appointments.  Your final results after the operation will depend on proper follow-up.  The initial visit is usually 2 to 5 days after the operation.  During this visit, the remaining nasal  packing and internal septal splints will be removed.  Your nasal and sinus cavities will be cleaned.  During the second visit, your nasal and sinus cavities will be cleaned again. Have someone drive you to your first two postoperative appointments.  How you care for your nose after the operation will influence the results that you obtain.  You should follow all directions, take your medication as prescribed, and call our office [phone] with any problems or questions. You may be more comfortable sleeping with your head elevated on two pillows. Do not take any medications that we have not prescribed or recommended. WARNING SIGNS: if any of the following should occur, please call our office: Persistent fever greater than 102F. Persistent vomiting. Severe and constant pain that is not relieved by prescribed pain medication. Trauma to the nose. Rash or unusual side effects from any medicines.   Percocet PRN Amoxicillin for 3 days Follow up in office in 3 days for splint removal Post Anesthesia Home Care Instructions  Activity: Get plenty of rest for the remainder of the day. A responsible individual must stay with you for 24 hours following the procedure.  For the next 24 hours, DO NOT: -Drive a car -Paediatric nurse -Drink alcoholic beverages -Take any medication unless instructed by your physician -Make any legal decisions or sign important papers.  Meals: Start with liquid foods such as gelatin or soup. Progress to regular foods as tolerated. Avoid greasy, spicy, heavy foods. If nausea and/or vomiting occur, drink only clear liquids until the nausea and/or vomiting  subsides. Call your physician if vomiting continues.  Special Instructions/Symptoms: Your throat may feel dry or sore from the anesthesia or the breathing tube placed in your throat during surgery. If this causes discomfort, gargle with warm salt water. The discomfort should disappear within 24 hours.  If you had a  scopolamine patch placed behind your ear for the management of post- operative nausea and/or vomiting:  1. The medication in the patch is effective for 72 hours, after which it should be removed.  Wrap patch in a tissue and discard in the trash. Wash hands thoroughly with soap and water. 2. You may remove the patch earlier than 72 hours if you experience unpleasant side effects which may include dry mouth, dizziness or visual disturbances. 3. Avoid touching the patch. Wash your hands with soap and water after contact with the patch.

## 2022-02-02 NOTE — Anesthesia Preprocedure Evaluation (Addendum)
Anesthesia Evaluation  Patient identified by MRN, date of birth, ID band Patient awake    Reviewed: Allergy & Precautions, NPO status , Patient's Chart, lab work & pertinent test results  Airway Mallampati: II  TM Distance: >3 FB Neck ROM: Full    Dental  (+) Chipped,    Pulmonary former smoker   Pulmonary exam normal        Cardiovascular negative cardio ROS Normal cardiovascular exam     Neuro/Psych  Headaches  negative psych ROS   GI/Hepatic negative GI ROS, Neg liver ROS,,,  Endo/Other  diabetes    Renal/GU negative Renal ROS     Musculoskeletal  (+) Arthritis ,    Abdominal   Peds  Hematology negative hematology ROS (+)   Anesthesia Other Findings  DEVIATED SEPTUM  BILATERAL TURBINATE HYPERTROPHY  RIGHT MAXILARY SINUSITIS  RIGHT ETHMOID SINUSITIS  RIGHT FRONTAL SINUSITIS    Reproductive/Obstetrics                             Anesthesia Physical Anesthesia Plan  ASA: 2  Anesthesia Plan: General   Post-op Pain Management:    Induction: Intravenous  PONV Risk Score and Plan: 4 or greater and Ondansetron, Dexamethasone, Propofol infusion, Midazolam and Treatment may vary due to age or medical condition  Airway Management Planned: Oral ETT  Additional Equipment:   Intra-op Plan:   Post-operative Plan: Extubation in OR  Informed Consent: I have reviewed the patients History and Physical, chart, labs and discussed the procedure including the risks, benefits and alternatives for the proposed anesthesia with the patient or authorized representative who has indicated his/her understanding and acceptance.     Dental advisory given  Plan Discussed with: CRNA  Anesthesia Plan Comments:        Anesthesia Quick Evaluation

## 2022-02-02 NOTE — Transfer of Care (Signed)
Immediate Anesthesia Transfer of Care Note  Patient: Jacqueline Fischer  Procedure(s) Performed: SINUS ENDOSCOPY WITH STEALTH NAVIGATION (Bilateral: Nose) NASAL SEPTOPLASTY WITH TURBINATE REDUCTION (Bilateral: Nose) ENDOSCOPIC MAXILLARY ANTROSTOMY (Right: Nose) ENDOSCOPIC RIGHT TOTAL ETHMOIDECTOMY AND FRONTAL RECESS EXPLORATION (Right: Nose)  Patient Location: PACU  Anesthesia Type:General  Level of Consciousness: drowsy  Airway & Oxygen Therapy: Patient Spontanous Breathing and Patient connected to face mask oxygen  Post-op Assessment: Report given to RN and Post -op Vital signs reviewed and stable  Post vital signs: Reviewed and stable  Last Vitals:  Vitals Value Taken Time  BP 155/75   Temp 97.3   Pulse 85   Resp 16   SpO2 97     Last Pain:  Vitals:   02/02/22 0753  TempSrc: Oral  PainSc: 0-No pain      Patients Stated Pain Goal: 3 (71/85/50 1586)  Complications: No notable events documented.

## 2022-02-02 NOTE — Anesthesia Postprocedure Evaluation (Signed)
Anesthesia Post Note  Patient: Jacqueline Fischer  Procedure(s) Performed: SINUS ENDOSCOPY WITH STEALTH NAVIGATION (Bilateral: Nose) NASAL SEPTOPLASTY WITH TURBINATE REDUCTION (Bilateral: Nose) ENDOSCOPIC MAXILLARY ANTROSTOMY (Right: Nose) ENDOSCOPIC RIGHT TOTAL ETHMOIDECTOMY AND FRONTAL RECESS EXPLORATION (Right: Nose)     Patient location during evaluation: PACU Anesthesia Type: General Level of consciousness: awake Pain management: pain level controlled Vital Signs Assessment: post-procedure vital signs reviewed and stable Respiratory status: spontaneous breathing, nonlabored ventilation, respiratory function stable and patient connected to nasal cannula oxygen Cardiovascular status: blood pressure returned to baseline and stable Postop Assessment: no apparent nausea or vomiting Anesthetic complications: no   No notable events documented.  Last Vitals:  Vitals:   02/02/22 1310 02/02/22 1330  BP:  (!) 147/71  Pulse: 84 87  Resp: 10 12  Temp:  37.3 C  SpO2: 95% 94%    Last Pain:  Vitals:   02/02/22 1330  TempSrc:   PainSc: 3                  Daveena Elmore P Mindy Behnken

## 2022-02-02 NOTE — Anesthesia Procedure Notes (Signed)
Procedure Name: Intubation Date/Time: 02/02/2022 9:35 AM  Performed by: Ezequiel Kayser, CRNAPre-anesthesia Checklist: Patient identified, Emergency Drugs available, Suction available and Patient being monitored Patient Re-evaluated:Patient Re-evaluated prior to induction Oxygen Delivery Method: Circle System Utilized Preoxygenation: Pre-oxygenation with 100% oxygen Induction Type: IV induction Ventilation: Mask ventilation without difficulty Laryngoscope Size: Glidescope and 3 Grade View: Grade II Tube type: Oral Rae Tube size: 7.0 mm Number of attempts: 2 Airway Equipment and Method: Stylet and Oral airway Placement Confirmation: ETT inserted through vocal cords under direct vision, positive ETCO2 and breath sounds checked- equal and bilateral Secured at: 19 cm Tube secured with: Tape Dental Injury: Teeth and Oropharynx as per pre-operative assessment  Comments: 1st attempt with Mac 3, unable to displace epiglottis to visualize vocal cords. 2nd attempt with Glidescope 3, only able to achieve grade 2 view due to epiglottis unable to be displaced

## 2022-02-02 NOTE — Op Note (Signed)
DATE OF PROCEDURE: 02/02/2022  OPERATIVE REPORT   SURGEON: Newman Pies, MD   PREOPERATIVE DIAGNOSES:  1. Severe nasal septal deviation.  2. Bilateral inferior turbinate hypertrophy.  3. Chronic nasal obstruction. 4. Chronic right maxillary, ethmoid, and frontal sinusitis and polyposis.  POSTOPERATIVE DIAGNOSES:  1. Severe nasal septal deviation.  2. Bilateral inferior turbinate hypertrophy.  3. Chronic nasal obstruction. 4. Chronic right maxillary, ethmoid, and frontal sinusitis and polyposis.  PROCEDURE PERFORMED:  1.  Septoplasty.  2.  Bilateral partial inferior turbinate resection.  3.  Endoscopic right frontal sinusotomy and total ethmoidectomy with polyp removal... 4.  Endoscopic right maxillary antrostomy with polyp removal. 5.  FUSION stereotactic image guidance.  Facial surgery.  ANESTHESIA: General endotracheal tube anesthesia.   COMPLICATIONS: None.   ESTIMATED BLOOD LOSS: 200 mL.   INDICATION FOR PROCEDURE: Jacqueline Fischer is a 66 y.o. female with a history of chronic rhinosinusitis, sinonasal polyps, chronic nasal obstruction. The patient was treated with multiple antibiotics, antihistamine, decongestant, systemic steroid, and steroid nasal sprays. However, the patient continued to be symptomatic. On examination, the patient was noted to have bilateral severe inferior turbinate hypertrophy and significant nasal septal deviation, causing significant nasal obstruction.  Her CT scan also showed complete opacification of her right maxillary sinus, as well as significant disease of the right ethmoid and frontal sinuses.  Based on the above findings, the decision was made for the patient to undergo the above-stated procedures. The risks, benefits, alternatives, and details of the procedures were discussed with the patient. Questions were invited and answered. Informed consent was obtained.   DESCRIPTION OF PROCEDURE: The patient was taken to the operating room and placed supine on  the operating table. General endotracheal tube anesthesia was administered by the anesthesiologist. The patient was positioned, and prepped and draped in the standard fashion for nasal surgery. Pledgets soaked with Afrin were placed in both nasal cavities for decongestion. The pledgets were subsequently removed. The FUSION stereotactic image guidance marker was placed. The image guidance system was functional throughout the case.  Examination of the nasal cavity revealed a severe nasal septal deviation. 1% lidocaine with 1:100,000 epinephrine was injected onto the nasal septum bilaterally. A hemitransfixion incision was made on the left side. The mucosal flap was carefully elevated on the left side. A cartilaginous incision was made 1 cm superior to the caudal margin of the nasal septum. Mucosal flap was also elevated on the right side in the similar fashion. It should be noted that due to the severe septal deviation, the deviated portion of the cartilaginous and bony septum had to be removed in piecemeal fashion. Once the deviated portions were removed, a straight midline septum was achieved. The septum was then quilted with 4-0 plain gut sutures. The hemitransfixion incision was closed with interrupted 4-0 chromic sutures.   The inferior one half of both hypertrophied inferior turbinate was crossclamped with a Kelly clamp. The inferior one half of each inferior turbinate was then resected with a pair of cross cutting scissors. Hemostasis was achieved with a suction cautery device.   Using a 0 endoscope, the right nasal cavity was examined.  A large amount of polypoid tissue was noted within the right nasal cavity. The polypoid tissue was removed using a combination of microdebrider and Blakesley forceps. The uncinate process was resected with a freer elevator. The maxillary antrum was entered and enlarged using a combination of backbiter and microdebrider. Polypoid tissue was removed from the right  maxillary sinus.  Attention was then  focused on the ethmoid sinuses. The bony partitions of the anterior and posterior ethmoid cavities were taken down. Polypoid tissue was noted and removed.  Attention was then focused on the frontal sinus. The frontal recess was identified and enlarged by removing the surrounding bony partitions. Polypoid tissue was removed from the frontal recess. All paranasal sinuses were copiously irrigated with saline solution. Doyle splints were applied to the nasal septum.  The care of the patient was turned over to the anesthesiologist. The patient was awakened from anesthesia without difficulty. The patient was extubated and transferred to the recovery room in good condition.   OPERATIVE FINDINGS: Chronic right maxillary, ethmoid, and frontal sinusitis and polyposis.  Severe nasal septal deviation and bilateral inferior turbinate hypertrophy.   SPECIMEN: Right sinus contents.   FOLLOWUP CARE: The patient be discharged home once she is awake and alert. The patient will be placed on Percocet p.r.n. pain, and amoxicillin 875 mg p.o. b.i.d. for 3 days. The patient will follow up in my office in 3 days for splint removal.   Phillis Thackeray Raynelle Bring, MD

## 2022-02-02 NOTE — H&P (Signed)
Cc: Chronic nasal obstruction, recurrent sinusitis, sinonasal polyps  HPI: The patient is a 66 year old female who returns today for her follow-up evaluation. The patient has a history of frequent recurrent rhinosinusitis, bilateral sinonasal polyps and chronic nasal obstruction.  At her last visit, she was noted to have severe nasal septal deviation, bilateral inferior turbinate hypertrophy, and large polypoid tissue within the right nasal cavity.  She subsequently underwent a sinus CT scan.  The CT showed dental abscess of the left first and second maxillary molars.  She also has complete opacification of her right maxillary sinus as well as significant disease of the right ethmoid and frontal sinuses.  The patient returns complaining of persistent nasal obstruction and facial pressure, especially on the right side.  Her left maxillary molars were extracted by her oral surgeon.  She was previously treated with multiple courses of antibiotics, systemic and topical steroids without improvement in her symptoms.  She is interested in more definitive treatment.   Exam: General: Communicates without difficulty, well nourished, no acute distress. Head: Normocephalic, no evidence injury, no tenderness, facial buttresses intact without stepoff. Face/sinus: No tenderness to palpation and percussion. Facial movement is normal and symmetric. Eyes: PERRL, EOMI. No scleral icterus, conjunctivae clear. Neuro: CN II exam reveals vision grossly intact.  No nystagmus at any point of gaze. Ears: Auricles well formed without lesions.  Ear canals are intact without mass or lesion.  No erythema or edema is appreciated.  The TMs are intact without fluid. Nose: External evaluation reveals normal support and skin without lesions.  Dorsum is intact.  Anterior rhinoscopy reveals congested mucosa over anterior aspect of inferior turbinates and deviated septum. Oral:  Oral cavity and oropharynx are intact, symmetric, without erythema or  edema.  Mucosa is moist without lesions. Neck: Full range of motion without pain.  There is no significant lymphadenopathy.  No masses palpable.  Thyroid bed within normal limits to palpation.  Parotid glands and submandibular glands equal bilaterally without mass.  Trachea is midline. Neuro:  CN 2-12 grossly intact. Gait normal. A flexible scope was inserted into the right nasal cavity.  Endoscopy of the interior nasal cavity, superior, inferior, and middle meatus was performed. The sphenoid-ethmoid recess was examined. Edematous mucosa was noted.  No polyp, mass, or lesion was appreciated. Nasal septal deviation noted.  Olfactory cleft was clear.  Nasopharynx was clear.  Turbinates were hypertrophied but without mass. The procedure was repeated on the contralateral side with similar findings.  The patient tolerated the procedure well.  Assessment: 1.  Chronic right maxillary, ethmoid and frontal sinusitis with large polyposis.   2.  Severe nasal septal deviation and bilateral inferior turbinate hypertrophy.  3.  Left sided maxillary abscess secondary to infection of her left first and second maxillary molars.  She has since undergone extraction of the diseased teeth.   Plan: 1.  The nasal endoscopy findings and the CT images are reviewed with the patient.  2.  Continue with Flonase nasal spray daily.  3.  Based on the above findings, the patient will likely benefit from surgical intervention, including right-sided endoscopic sinus surgery, septoplasty, and turbinate reduction.  The risks, benefits, alternatives and details of the procedures are reviewed with the patient.  4.  The patient would like to proceed with the procedures.

## 2022-02-03 ENCOUNTER — Encounter (HOSPITAL_BASED_OUTPATIENT_CLINIC_OR_DEPARTMENT_OTHER): Payer: Self-pay | Admitting: Otolaryngology

## 2022-02-03 DIAGNOSIS — J322 Chronic ethmoidal sinusitis: Secondary | ICD-10-CM | POA: Diagnosis not present

## 2022-02-03 DIAGNOSIS — K047 Periapical abscess without sinus: Secondary | ICD-10-CM | POA: Diagnosis not present

## 2022-02-03 DIAGNOSIS — E119 Type 2 diabetes mellitus without complications: Secondary | ICD-10-CM | POA: Diagnosis not present

## 2022-02-03 DIAGNOSIS — J321 Chronic frontal sinusitis: Secondary | ICD-10-CM | POA: Diagnosis not present

## 2022-02-03 DIAGNOSIS — J342 Deviated nasal septum: Secondary | ICD-10-CM | POA: Diagnosis not present

## 2022-02-03 DIAGNOSIS — J320 Chronic maxillary sinusitis: Secondary | ICD-10-CM | POA: Diagnosis not present

## 2022-02-03 DIAGNOSIS — J3489 Other specified disorders of nose and nasal sinuses: Secondary | ICD-10-CM | POA: Diagnosis not present

## 2022-02-03 DIAGNOSIS — J343 Hypertrophy of nasal turbinates: Secondary | ICD-10-CM | POA: Diagnosis not present

## 2022-02-03 DIAGNOSIS — Z87891 Personal history of nicotine dependence: Secondary | ICD-10-CM | POA: Diagnosis not present

## 2022-02-03 LAB — SURGICAL PATHOLOGY

## 2022-02-03 NOTE — Discharge Summary (Signed)
Physician Discharge Summary  Patient ID: Jacqueline Fischer MRN: [id number] DOB/AGE: [birth year] 66 y.o.  Admit date: 02/02/2022 Discharge date: 02/03/2022  Admission Diagnoses: Chronic sinusitis and polyposis  Discharge Diagnoses: Chronic sinusitis and polyposis Principal Problem:   S/P functional endoscopic sinus surgery   Discharged Condition: good  Hospital Course: Pt had an uneventful overnight stay. Pt tolerated po well. No bleeding. No stridor.   Consults: None  Significant Diagnostic Studies: None  Treatments: surgery: Endoscopic sinus surgery  Discharge Exam: Blood pressure 131/71, pulse 80, temperature 98.4 F (36.9 C), resp. rate 16, height 5\' 2"  (1.575 m), weight 77.1 kg, SpO2 96 %.    Disposition: Discharge disposition: 01-Home or Self Care       Discharge Instructions     Activity as tolerated - No restrictions   Complete by: As directed    Diet general   Complete by: As directed    No wound care   Complete by: As directed       Allergies as of 02/03/2022       Reactions   Hydrocodone Nausea And Vomiting   Oxycodone Nausea And Vomiting   Tetracyclines & Related Rash, Other (See Comments)   Childhood allergy        Medication List     TAKE these medications    almotriptan 12.5 MG tablet Commonly known as: AXERT Take 12.5 mg by mouth as needed for migraine. may repeat in 2 hours if needed   amoxicillin 875 MG tablet Commonly known as: AMOXIL Take 1 tablet (875 mg total) by mouth 2 (two) times daily for 3 days.   Beclomethasone Dipropionate 80 MCG/ACT Aers Commonly known as: Qnasl Use one to two puffs in each nostril once daily.   montelukast 10 MG tablet Commonly known as: SINGULAIR Take 1 tablet (10 mg total) by mouth at bedtime.   oxyCODONE-acetaminophen 5-325 MG tablet Commonly known as: Percocet Take 1 tablet by mouth every 6 (six) hours as needed for up to 2 days for severe pain.   VITAMIN C PO Take by mouth.   VITAMIN D  PO Take by mouth.        Follow-up Information     Leta Baptist, MD Follow up on 02/05/2022.   Specialty: Otolaryngology Why: As scheduled Contact information: [address] [place] [address] [phone]                 Signed: Burley Saver 02/03/2022, 9:06 AM

## 2022-02-05 DIAGNOSIS — J322 Chronic ethmoidal sinusitis: Secondary | ICD-10-CM | POA: Diagnosis not present

## 2022-02-05 DIAGNOSIS — J320 Chronic maxillary sinusitis: Secondary | ICD-10-CM | POA: Diagnosis not present

## 2022-02-05 DIAGNOSIS — J321 Chronic frontal sinusitis: Secondary | ICD-10-CM | POA: Diagnosis not present

## 2022-02-05 DIAGNOSIS — J338 Other polyp of sinus: Secondary | ICD-10-CM | POA: Diagnosis not present

## 2022-02-13 ENCOUNTER — Telehealth: Payer: Self-pay

## 2022-02-13 NOTE — Telephone Encounter (Signed)
LVMT requesting call back reference next PREP class starting 02/24/22

## 2022-03-02 DIAGNOSIS — J320 Chronic maxillary sinusitis: Secondary | ICD-10-CM | POA: Diagnosis not present

## 2022-03-02 DIAGNOSIS — J338 Other polyp of sinus: Secondary | ICD-10-CM | POA: Diagnosis not present

## 2022-03-02 DIAGNOSIS — J321 Chronic frontal sinusitis: Secondary | ICD-10-CM | POA: Diagnosis not present

## 2022-03-02 DIAGNOSIS — J322 Chronic ethmoidal sinusitis: Secondary | ICD-10-CM | POA: Diagnosis not present

## 2022-03-04 ENCOUNTER — Telehealth: Payer: Self-pay

## 2022-03-04 NOTE — Telephone Encounter (Signed)
She called me to inquire about PREP classes at [facility] in Jan, she needs 4pm class. Will contact her toward end of year with class schedule and set up assessment visit.

## 2022-03-26 DIAGNOSIS — R739 Hyperglycemia, unspecified: Secondary | ICD-10-CM | POA: Diagnosis not present

## 2022-03-31 DIAGNOSIS — J321 Chronic frontal sinusitis: Secondary | ICD-10-CM | POA: Diagnosis not present

## 2022-03-31 DIAGNOSIS — J322 Chronic ethmoidal sinusitis: Secondary | ICD-10-CM | POA: Diagnosis not present

## 2022-03-31 DIAGNOSIS — J320 Chronic maxillary sinusitis: Secondary | ICD-10-CM | POA: Diagnosis not present

## 2022-03-31 DIAGNOSIS — J338 Other polyp of sinus: Secondary | ICD-10-CM | POA: Diagnosis not present

## 2022-04-02 ENCOUNTER — Telehealth: Payer: Self-pay

## 2022-04-02 NOTE — Telephone Encounter (Signed)
Called to confirm participation in next PREP class on 1/15 at [facility] and set up assessment visit; left voicemail

## 2022-04-03 ENCOUNTER — Telehealth: Payer: Self-pay

## 2022-04-03 NOTE — Telephone Encounter (Signed)
PREP assessment visit scheduled for Monday 04/06/22 at 3pm, to begin PREP class at [facility] 04/13/22, every M/W 3:30-4:45

## 2022-04-06 NOTE — Progress Notes (Signed)
[facility] PREP Evaluation  Patient Details  Name: Jacqueline Fischer MRN: [id number] Date of Birth: 1955-05-11 Age: 67 y.o. PCP: Johna Roles, PA  Vitals:   04/06/22 1535  BP: 114/64  Pulse: 90  SpO2: 95%  Weight: 168 lb 3.2 oz (76.3 kg)     [facility] Eval - 04/06/22 1500       [facility] "PREP" Location   [facility] "PREP" Location [facility]      Referral    Referring Provider --   Laurann Montana   Reason for referral Other   Pre-diabetes   Program Start Date 04/13/22      Measurement   Waist Circumference 41 inches    Hip Circumference 43 inches    Body fat --   E3     Information for Trainer   Goals --   Lose 10 pounds by end of program; A1c 6.4 or lower   Current Exercise --   water aerobics   Orthopedic Concerns --   OA knees   Pertinent Medical History --   Pre-diabetes     Timed Up and Go (TUGS)   Timed Up and Go Low risk <9 seconds      Mobility and Daily Activities   I find it easy to walk up or down two or more flights of stairs. 3    I have no trouble taking out the trash. 4    I do housework such as vacuuming and dusting on my own without difficulty. 4    I can easily lift a gallon of milk (8lbs). 4    I can easily walk a mile. 4    I have no trouble reaching into high cupboards or reaching down to pick up something from the floor. 4    I do not have trouble doing out-door work such as Armed forces logistics/support/administrative officer, raking leaves, or gardening. 4      Mobility and Daily Activities   I feel younger than my age. 4    I feel independent. 4    I feel energetic. 4    I live an active life.  4    I feel strong. 2    I feel healthy. 2    I feel active as other people my age. 2      How fit and strong are you.   Fit and Strong Total Score 49            Past Medical History:  Diagnosis Date   Anemia    Years ago   Arthritis    Colitis    8315'V   Complication of anesthesia    pt states during nasal surgery she woke up during procedure   Diabetes mellitus without  complication (HCC)    Migraine    Pneumonia    PONV (postoperative nausea and vomiting)    Recurrent upper respiratory infection (URI)    Past Surgical History:  Procedure Laterality Date   CATARACT EXTRACTION Right    CHOLECYSTECTOMY N/A 10/19/2014   Procedure: LAPAROSCOPIC CHOLECYSTECTOMY;  Surgeon: Judeth Horn, MD;  Location: [place];  Service: General;  Laterality: N/A;   ETHMOIDECTOMY Right 02/02/2022   Procedure: ENDOSCOPIC RIGHT TOTAL ETHMOIDECTOMY AND FRONTAL RECESS EXPLORATION;  Surgeon: Leta Baptist, MD;  Location: [place];  Service: ENT;  Laterality: Right;   MAXILLARY ANTROSTOMY Right 02/02/2022   Procedure: ENDOSCOPIC MAXILLARY ANTROSTOMY;  Surgeon: Leta Baptist, MD;  Location: [place];  Service: ENT;  Laterality: Right;  NASAL POLYP SURGERY     6 polyps removed   NASAL SEPTOPLASTY W/ TURBINOPLASTY Bilateral 02/02/2022   Procedure: NASAL SEPTOPLASTY WITH TURBINATE REDUCTION;  Surgeon: Newman Pies, MD;  Location: [facility];  Service: ENT;  Laterality: Bilateral;   SINOSCOPY     SINUS ENDO WITH FUSION Bilateral 02/02/2022   Procedure: SINUS ENDOSCOPY WITH STEALTH NAVIGATION;  Surgeon: Newman Pies, MD;  Location:  SURGERY CENTER;  Service: ENT;  Laterality: Bilateral;   TUBAL LIGATION     Social History   Tobacco Use  Smoking Status Former   Types: Cigarettes   Quit date: 10/17/1973   Years since quitting: 48.5  Smokeless Tobacco Never  To begin PREP class at [facility] 04/13/22, every M/W 3:30-4:45  Sonia Baller 04/06/2022, 3:38 PM

## 2022-04-20 NOTE — Progress Notes (Signed)
[facility] PREP Weekly Session  Patient Details  Name: Jacqueline Fischer MRN: [id number] Date of Birth: 1955-08-12 Age: 67 y.o. PCP: Johna Roles, PA  Vitals:   04/20/22 1647  Weight: 166 lb (75.3 kg)     [facility] Weekly seesion - 04/20/22 1600       [facility] "PREP" Location   [facility] "PREP" Location [facility]      Weekly Session   Topic Discussed Importance of resistance training;Other ways to be active   Cardio: work up to 150 minutes/week; strength training start at twice a week, work up for 3 times a week for 20-40 minutes   Minutes exercised this week 80 minutes    Classes attended to date [place] 04/20/2022, 4:48 PM

## 2022-04-28 NOTE — Progress Notes (Signed)
[facility] PREP Weekly Session  Patient Details  Name: Jacqueline Fischer MRN: [id number] Date of Birth: 15-Feb-1956 Age: 67 y.o. PCP: Johna Roles, PA  Vitals:   04/28/22 0842  Weight: 167 lb (75.8 kg)     [facility] Weekly seesion - 04/28/22 0800       [facility] "PREP" Location   [facility] "PREP" Location [facility]      Weekly Session   Topic Discussed Healthy eating tips   Foods to reduce, foods to increase: introduction to [employer] app; water: 1/2 body wt in oz.; eat the rainbow of colors   Minutes exercised this week 130 minutes    Classes attended to date [place] 04/28/2022, 8:42 AM

## 2022-05-04 NOTE — Progress Notes (Signed)
[facility] PREP Weekly Session  Patient Details  Name: Jacqueline Fischer MRN: [id number] Date of Birth: 09/24/1955 Age: 67 y.o. PCP: Johna Roles, PA  Vitals:   05/04/22 1650  Weight: 165 lb 6.4 oz (75 kg)     [facility] Weekly seesion - 05/04/22 1600       [facility] "PREP" Location   [facility] "PREP" Location [facility]      Weekly Session   Topic Discussed Health habits   Sugar demo   Minutes exercised this week 140 minutes    Classes attended to date [place] 05/04/2022, 4:51 PM

## 2022-05-11 NOTE — Progress Notes (Signed)
[facility] PREP Weekly Session  Patient Details  Name: Jacqueline Fischer MRN: [id number] Date of Birth: 05-21-1955 Age: 67 y.o. PCP: Johna Roles, PA  Vitals:   05/11/22 1635  Weight: 168 lb 6.4 oz (76.4 kg)     [facility] Weekly seesion - 05/11/22 1600       [facility] "PREP" Location   [facility] "PREP" Location [facility]      Weekly Session   Topic Discussed Restaurant Eating   Salt demo, limit Na intake to 1500-2328m/day;   Minutes exercised this week 130 minutes    Classes attended to date [place]2/02/2023, 4:37 PM

## 2022-05-18 NOTE — Progress Notes (Signed)
[facility] PREP Weekly Session  Patient Details  Name: Jacqueline Fischer MRN: [id number] Date of Birth: 03/11/1956 Age: 67 y.o. PCP: Johna Roles, PA  Vitals:   05/18/22 1651  Weight: 167 lb 3.2 oz (75.8 kg)     [facility] Weekly seesion - 05/18/22 1600       [facility] "PREP" Location   [facility] "PREP" Location [facility]      Weekly Session   Topic Discussed Stress management and problem solving   Importance of 7-9 hours/night of sleep; sleep guidelines   Minutes exercised this week 145 minutes    Classes attended to date [place] 05/18/2022, 4:53 PM

## 2022-05-25 NOTE — Progress Notes (Signed)
[facility] PREP Weekly Session  Patient Details  Name: JORGA PERRICONE MRN: [id number] Date of Birth: June 20, 1955 Age: 67 y.o. PCP: Johna Roles, PA  Vitals:   05/25/22 1634  Weight: 166 lb 12.8 oz (75.7 kg)     [facility] Weekly seesion - 05/25/22 1600       [facility] "PREP" Location   [facility] "PREP" Location [facility]      Weekly Session   Topic Discussed Expectations and non-scale victories   Staying positive; Halfway through program, tiime to revisit, refocus, restate goals   Classes attended to date [place] 05/25/2022, 4:36 PM

## 2022-05-27 DIAGNOSIS — J324 Chronic pansinusitis: Secondary | ICD-10-CM | POA: Diagnosis not present

## 2022-05-27 DIAGNOSIS — J343 Hypertrophy of nasal turbinates: Secondary | ICD-10-CM | POA: Diagnosis not present

## 2022-05-27 DIAGNOSIS — R0982 Postnasal drip: Secondary | ICD-10-CM | POA: Diagnosis not present

## 2022-06-01 NOTE — Progress Notes (Signed)
[facility] PREP Weekly Session  Patient Details  Name: Jacqueline Fischer MRN: [id number] Date of Birth: [birth year] Age: 67 y.o. PCP: Johna Roles, PA  Vitals:   06/01/22 1639  Weight: 166 lb 6.4 oz (75.5 kg)     [facility] Weekly seesion - 06/01/22 1600       [facility] "PREP" Location   [facility] "PREP" Location [facility]      Weekly Session   Topic Discussed Other   Portion Size Matters; Visualize your portion size demo; review of [employer] food label/packaging   Classes attended to date [place] 06/01/2022, 4:40 PM

## 2022-06-08 NOTE — Progress Notes (Signed)
[facility] PREP Weekly Session  Patient Details  Name: Jacqueline Fischer MRN: [id number] Date of Birth: 10/07/1955 Age: 67 y.o. PCP: Johna Roles, PA  Vitals:   06/08/22 1651  Weight: 166 lb 12.8 oz (75.7 kg)     [facility] Weekly seesion - 06/08/22 1600       [facility] "PREP" Location   [facility] "PREP" Location [facility]      Weekly Session   Topic Discussed Finding support   Review of variety of food labels brought from home   Classes attended to date [place] 06/08/2022, 4:57 PM

## 2022-06-15 NOTE — Progress Notes (Signed)
[facility] PREP Weekly Session  Patient Details  Name: Jacqueline Fischer MRN: [id number] Date of Birth: 06/28/1955 Age: 67 y.o. PCP: Johna Roles, PA  Vitals:   06/15/22 1642  Weight: 167 lb 3.2 oz (75.8 kg)     [facility] Weekly seesion - 06/15/22 1600       [facility] "PREP" Location   [facility] "PREP" Location [facility]      Weekly Session   Topic Discussed Calorie breakdown   Fats, carbs, proteins current USDA guidelines; difference between simple and complex carbs; Y membership talk options by Merrilee Seashore.   Classes attended to date [place] 06/15/2022, 4:44 PM

## 2022-06-22 NOTE — Progress Notes (Signed)
[facility] PREP Weekly Session  Patient Details  Name: Jacqueline Fischer MRN: [id number] Date of Birth: 1956-01-28 Age: 67 y.o. PCP: Johna Roles, PA  Vitals:   06/22/22 1647  Weight: 167 lb 6.4 oz (75.9 kg)     [facility] Weekly seesion - 06/22/22 1600       [facility] "PREP" Location   [facility] "PREP" Location [facility]      Weekly Session   Topic Discussed Hitting roadblocks   Review of exercise/nutrition guidelines; discussed completing PREP survey and goals and activity plan for next 3 months after PREP; will bring both forms to final assessment visti next Wednesday.   Classes attended to date [place] 06/22/2022, 4:50 PM

## 2022-06-29 NOTE — Progress Notes (Signed)
[facility] PREP Weekly Session  Patient Details  Name: Jacqueline Fischer MRN: [id number] Date of Birth: 1955/10/08 Age: 67 y.o. PCP: Johna Roles, PA  Vitals:   06/29/22 1658  Weight: 167 lb 6.4 oz (75.9 kg)     [facility] Weekly seesion - 06/29/22 1600       [facility] "PREP" Location   [facility] "PREP" Location [facility]      Weekly Session   Topic Discussed Other   Fit testing completed, how fit and strong survey completed, final assessment visit scheduled for Wednesday, asked to complete PREP program survey along with goals and activity plan for next 3 months.   Classes attended to date [place] 06/29/2022, 4:58 PM

## 2022-07-01 NOTE — Progress Notes (Signed)
[facility] PREP Evaluation  Patient Details  Name: Jacqueline Fischer MRN: [id number] Date of Birth: Jun 23, 1955 Age: 67 y.o. PCP: Johna Roles, PA  Vitals:   07/01/22 1531  BP: 108/64  Pulse: 93  SpO2: 97%  Weight: 167 lb 6.4 oz (75.9 kg)     [facility] Eval - 07/01/22 1500       [facility] "PREP" Location   [facility] "PREP" Location [facility]      Referral    Program End Date 07/01/22      Measurement   Waist Circumference End Program 41 inches    Hip Circumference End Program 43 inches    Body fat 40.8 percent      Mobility and Daily Activities   I find it easy to walk up or down two or more flights of stairs. 2    I have no trouble taking out the trash. 4    I do housework such as vacuuming and dusting on my own without difficulty. 4    I can easily lift a gallon of milk (8lbs). 4    I can easily walk a mile. 4    I have no trouble reaching into high cupboards or reaching down to pick up something from the floor. 2    I do not have trouble doing out-door work such as Armed forces logistics/support/administrative officer, raking leaves, or gardening. 1      Mobility and Daily Activities   I feel younger than my age. 3    I feel independent. 3    I feel energetic. 3    I live an active life.  4    I feel strong. 3    I feel healthy. 3    I feel active as other people my age. 2      How fit and strong are you.   Fit and Strong Total Score 42            Past Medical History:  Diagnosis Date   Anemia    Years ago   Arthritis    Colitis    [id number]   Complication of anesthesia    pt states during nasal surgery she woke up during procedure   Diabetes mellitus without complication (HCC)    Migraine    Pneumonia    PONV (postoperative nausea and vomiting)    Recurrent upper respiratory infection (URI)    Past Surgical History:  Procedure Laterality Date   CATARACT EXTRACTION Right    CHOLECYSTECTOMY N/A 10/19/2014   Procedure: LAPAROSCOPIC CHOLECYSTECTOMY;  Surgeon: Judeth Horn, MD;  Location: [place];  Service: General;  Laterality: N/A;   ETHMOIDECTOMY Right 02/02/2022   Procedure: ENDOSCOPIC RIGHT TOTAL ETHMOIDECTOMY AND FRONTAL RECESS EXPLORATION;  Surgeon: Leta Baptist, MD;  Location: [place];  Service: ENT;  Laterality: Right;   MAXILLARY ANTROSTOMY Right 02/02/2022   Procedure: ENDOSCOPIC MAXILLARY ANTROSTOMY;  Surgeon: Leta Baptist, MD;  Location: [place];  Service: ENT;  Laterality: Right;   NASAL POLYP SURGERY     6 polyps removed   NASAL SEPTOPLASTY W/ TURBINOPLASTY Bilateral 02/02/2022   Procedure: NASAL SEPTOPLASTY WITH TURBINATE REDUCTION;  Surgeon: Leta Baptist, MD;  Location: [place];  Service: ENT;  Laterality: Bilateral;   SINOSCOPY     SINUS ENDO WITH FUSION Bilateral 02/02/2022   Procedure: SINUS ENDOSCOPY WITH STEALTH NAVIGATION;  Surgeon: Leta Baptist, MD;  Location: [place];  Service: ENT;  Laterality: Bilateral;  TUBAL LIGATION     Social History   Tobacco Use  Smoking Status Former   Types: Cigarettes   Quit date: 10/17/1973   Years since quitting: 48.7  Smokeless Tobacco Never  Wt loss: 0.8 pounds   Yevonne Aline 07/01/2022, 3:32 PM

## 2022-08-03 DIAGNOSIS — E782 Mixed hyperlipidemia: Secondary | ICD-10-CM | POA: Diagnosis not present

## 2022-08-03 DIAGNOSIS — I10 Essential (primary) hypertension: Secondary | ICD-10-CM | POA: Diagnosis not present

## 2022-08-03 DIAGNOSIS — G43919 Migraine, unspecified, intractable, without status migrainosus: Secondary | ICD-10-CM | POA: Diagnosis not present

## 2022-08-03 DIAGNOSIS — E119 Type 2 diabetes mellitus without complications: Secondary | ICD-10-CM | POA: Diagnosis not present

## 2022-09-21 DIAGNOSIS — M5412 Radiculopathy, cervical region: Secondary | ICD-10-CM | POA: Diagnosis not present

## 2022-09-21 DIAGNOSIS — J324 Chronic pansinusitis: Secondary | ICD-10-CM | POA: Diagnosis not present

## 2022-09-21 DIAGNOSIS — R0982 Postnasal drip: Secondary | ICD-10-CM | POA: Diagnosis not present

## 2022-09-21 DIAGNOSIS — J343 Hypertrophy of nasal turbinates: Secondary | ICD-10-CM | POA: Diagnosis not present

## 2022-09-21 DIAGNOSIS — R202 Paresthesia of skin: Secondary | ICD-10-CM | POA: Diagnosis not present

## 2022-09-23 ENCOUNTER — Ambulatory Visit
Admission: RE | Admit: 2022-09-23 | Discharge: 2022-09-23 | Disposition: A | Discharge status: Home / Self Care | Payer: BC Managed Care – PPO | Source: Ambulatory Visit | Attending: Internal Medicine | Admitting: Internal Medicine

## 2022-09-23 ENCOUNTER — Other Ambulatory Visit: Payer: Self-pay | Admitting: Internal Medicine

## 2022-09-23 DIAGNOSIS — J398 Other specified diseases of upper respiratory tract: Secondary | ICD-10-CM | POA: Diagnosis not present

## 2022-09-23 DIAGNOSIS — M5412 Radiculopathy, cervical region: Secondary | ICD-10-CM

## 2022-09-23 DIAGNOSIS — M50321 Other cervical disc degeneration at C4-C5 level: Secondary | ICD-10-CM | POA: Diagnosis not present

## 2022-10-14 DIAGNOSIS — E049 Nontoxic goiter, unspecified: Secondary | ICD-10-CM | POA: Diagnosis not present

## 2022-10-14 DIAGNOSIS — E042 Nontoxic multinodular goiter: Secondary | ICD-10-CM | POA: Diagnosis not present

## 2022-10-20 DIAGNOSIS — E042 Nontoxic multinodular goiter: Secondary | ICD-10-CM | POA: Diagnosis not present

## 2022-10-21 ENCOUNTER — Other Ambulatory Visit: Payer: Self-pay | Admitting: Nurse Practitioner

## 2022-10-21 DIAGNOSIS — E042 Nontoxic multinodular goiter: Secondary | ICD-10-CM

## 2022-12-07 DIAGNOSIS — J0100 Acute maxillary sinusitis, unspecified: Secondary | ICD-10-CM | POA: Diagnosis not present

## 2022-12-07 DIAGNOSIS — I10 Essential (primary) hypertension: Secondary | ICD-10-CM | POA: Diagnosis not present

## 2022-12-16 ENCOUNTER — Ambulatory Visit
Admission: RE | Admit: 2022-12-16 | Discharge: 2022-12-16 | Disposition: A | Discharge status: Home / Self Care | Payer: BC Managed Care – PPO | Source: Ambulatory Visit | Attending: Nurse Practitioner

## 2022-12-16 ENCOUNTER — Other Ambulatory Visit (HOSPITAL_COMMUNITY)
Admission: RE | Admit: 2022-12-16 | Discharge: 2022-12-16 | Disposition: A | Discharge status: Home / Self Care | Payer: BC Managed Care – PPO | Source: Ambulatory Visit | Attending: Nurse Practitioner | Admitting: Nurse Practitioner

## 2022-12-16 DIAGNOSIS — E042 Nontoxic multinodular goiter: Secondary | ICD-10-CM

## 2022-12-16 DIAGNOSIS — E041 Nontoxic single thyroid nodule: Secondary | ICD-10-CM | POA: Insufficient documentation

## 2022-12-16 DIAGNOSIS — E0789 Other specified disorders of thyroid: Secondary | ICD-10-CM | POA: Diagnosis not present

## 2022-12-17 LAB — CYTOLOGY - NON PAP

## 2022-12-24 DIAGNOSIS — E042 Nontoxic multinodular goiter: Secondary | ICD-10-CM | POA: Diagnosis not present

## 2023-01-05 DIAGNOSIS — E042 Nontoxic multinodular goiter: Secondary | ICD-10-CM | POA: Diagnosis not present

## 2023-02-18 DIAGNOSIS — E042 Nontoxic multinodular goiter: Secondary | ICD-10-CM | POA: Diagnosis not present

## 2023-02-18 DIAGNOSIS — L659 Nonscarring hair loss, unspecified: Secondary | ICD-10-CM | POA: Diagnosis not present

## 2023-02-18 DIAGNOSIS — Z23 Encounter for immunization: Secondary | ICD-10-CM | POA: Diagnosis not present

## 2023-02-18 DIAGNOSIS — Z0000 Encounter for general adult medical examination without abnormal findings: Secondary | ICD-10-CM | POA: Diagnosis not present

## 2023-02-18 DIAGNOSIS — J398 Other specified diseases of upper respiratory tract: Secondary | ICD-10-CM | POA: Diagnosis not present

## 2023-02-18 DIAGNOSIS — E049 Nontoxic goiter, unspecified: Secondary | ICD-10-CM | POA: Diagnosis not present

## 2023-02-18 DIAGNOSIS — E119 Type 2 diabetes mellitus without complications: Secondary | ICD-10-CM | POA: Diagnosis not present

## 2023-02-18 DIAGNOSIS — E782 Mixed hyperlipidemia: Secondary | ICD-10-CM | POA: Diagnosis not present

## 2023-03-18 ENCOUNTER — Encounter (INDEPENDENT_AMBULATORY_CARE_PROVIDER_SITE_OTHER): Payer: Self-pay

## 2023-03-18 ENCOUNTER — Ambulatory Visit (INDEPENDENT_AMBULATORY_CARE_PROVIDER_SITE_OTHER): Payer: BC Managed Care – PPO | Admitting: Otolaryngology

## 2023-03-18 VITALS — Ht 62.0 in | Wt 168.0 lb

## 2023-03-18 DIAGNOSIS — R0982 Postnasal drip: Secondary | ICD-10-CM

## 2023-03-18 DIAGNOSIS — R0981 Nasal congestion: Secondary | ICD-10-CM

## 2023-03-18 DIAGNOSIS — J310 Chronic rhinitis: Secondary | ICD-10-CM

## 2023-03-21 DIAGNOSIS — R0982 Postnasal drip: Secondary | ICD-10-CM | POA: Insufficient documentation

## 2023-03-21 DIAGNOSIS — J310 Chronic rhinitis: Secondary | ICD-10-CM | POA: Insufficient documentation

## 2023-03-21 NOTE — Progress Notes (Signed)
Patient ID: Jacqueline Fischer, female   DOB: 1955-12-19, 67 y.o.   MRN: [id number]  Follow-up: Chronic right maxillary, ethmoid, frontal sinusitis and polyposis  HPI: The patient is a 67 year old female who returns today for her follow-up evaluation.  The patient has a history of chronic right-sided rhinosinusitis and chronic nasal obstruction.  She underwent right maxillary, ethmoid, and frontal sinus surgery, septoplasty, and turbinate reduction surgery in November 2023.  The patient returns today reporting 1 episode of sinusitis in October 2024.  The infection has resolved.  However, she continues to have occasional nasal drainage.  She denies any facial pain or fever.  Exam: General: Communicates without difficulty, well nourished, no acute distress. Head: Normocephalic, no evidence injury, no tenderness, facial buttresses intact without stepoff. Face/sinus: No tenderness to palpation and percussion. Facial movement is normal and symmetric. Eyes: PERRL, EOMI. No scleral icterus, conjunctivae clear. Neuro: CN II exam reveals vision grossly intact.  No nystagmus at any point of gaze. Ears: Auricles well formed without lesions.  Ear canals are intact without mass or lesion.  No erythema or edema is appreciated.  The TMs are intact without fluid. Nose: External evaluation reveals normal support and skin without lesions.  Dorsum is intact.  Anterior rhinoscopy reveals congested mucosa over anterior aspect of inferior turbinates and intact septum.  No purulence noted. Oral:  Oral cavity and oropharynx are intact, symmetric, without erythema or edema.  Mucosa is moist without lesions. Neck: Full range of motion without pain.  There is no significant lymphadenopathy.  No masses palpable.  Thyroid bed within normal limits to palpation.  Parotid glands and submandibular glands equal bilaterally without mass.  Trachea is midline. Neuro:  CN 2-12 grossly intact.   Assessment: 1.  Chronic rhinitis with nasal mucosal  congestion.  No acute sinusitis is noted today. 2.  Chronic postnasal drainage. 3.  Her septum and turbinates are well-healed.  Plan: 1.  The physical exam findings are reviewed with the patient. 2.  Continue Atrovent nasal spray 2 sprays each nostril twice daily to treat the postnasal drainage. 3.  Nasal saline irrigation is encouraged. 4.  The patient will return for reevaluation in 6 months.

## 2023-05-07 ENCOUNTER — Other Ambulatory Visit: Payer: Self-pay | Admitting: Surgery

## 2023-05-07 DIAGNOSIS — E041 Nontoxic single thyroid nodule: Secondary | ICD-10-CM

## 2023-06-14 ENCOUNTER — Inpatient Hospital Stay
Admission: RE | Admit: 2023-06-14 | Discharge: 2023-06-14 | Disposition: A | Discharge status: Home / Self Care | Payer: BC Managed Care – PPO | Source: Ambulatory Visit | Attending: Surgery | Admitting: Surgery

## 2023-07-23 ENCOUNTER — Other Ambulatory Visit

## 2023-08-04 ENCOUNTER — Other Ambulatory Visit

## 2023-08-04 ENCOUNTER — Ambulatory Visit
Admission: RE | Admit: 2023-08-04 | Discharge: 2023-08-04 | Disposition: A | Discharge status: Home / Self Care | Source: Ambulatory Visit | Attending: Surgery | Admitting: Surgery

## 2023-08-04 ENCOUNTER — Other Ambulatory Visit (HOSPITAL_COMMUNITY)
Admission: RE | Admit: 2023-08-04 | Discharge: 2023-08-04 | Disposition: A | Discharge status: Home / Self Care | Source: Ambulatory Visit | Attending: Surgery | Admitting: Surgery

## 2023-08-04 DIAGNOSIS — E041 Nontoxic single thyroid nodule: Secondary | ICD-10-CM | POA: Diagnosis present

## 2023-08-06 LAB — CYTOLOGY - NON PAP

## 2023-08-08 ENCOUNTER — Encounter: Payer: Self-pay | Admitting: Surgery

## 2023-08-08 NOTE — Progress Notes (Signed)
 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  Good news.  Plan follow up for one year with repeat USN and TSH level followed by physical exam in the office.  Jacqueline Billow, MD [facility] practice Office: [phone]

## 2023-08-22 ENCOUNTER — Other Ambulatory Visit: Payer: Self-pay

## 2023-08-22 ENCOUNTER — Emergency Department (HOSPITAL_COMMUNITY)

## 2023-08-22 ENCOUNTER — Encounter (HOSPITAL_BASED_OUTPATIENT_CLINIC_OR_DEPARTMENT_OTHER): Payer: Self-pay | Admitting: Emergency Medicine

## 2023-08-22 ENCOUNTER — Emergency Department (HOSPITAL_BASED_OUTPATIENT_CLINIC_OR_DEPARTMENT_OTHER)
Admission: EM | Admit: 2023-08-22 | Discharge: 2023-08-22 | Disposition: A | Discharge status: Home / Self Care | Attending: Emergency Medicine | Admitting: Emergency Medicine

## 2023-08-22 DIAGNOSIS — R110 Nausea: Secondary | ICD-10-CM | POA: Diagnosis not present

## 2023-08-22 DIAGNOSIS — J320 Chronic maxillary sinusitis: Secondary | ICD-10-CM

## 2023-08-22 DIAGNOSIS — R519 Headache, unspecified: Secondary | ICD-10-CM | POA: Insufficient documentation

## 2023-08-22 LAB — CBC WITH DIFFERENTIAL/PLATELET
Abs Immature Granulocytes: 0.01 10*3/uL (ref 0.00–0.07)
Basophils Absolute: 0.1 10*3/uL (ref 0.0–0.1)
Basophils Relative: 2 %
Eosinophils Absolute: 0.2 10*3/uL (ref 0.0–0.5)
Eosinophils Relative: 4 %
HCT: 44.1 % (ref 36.0–46.0)
Hemoglobin: 14.5 g/dL (ref 12.0–15.0)
Immature Granulocytes: 0 %
Lymphocytes Relative: 39 %
Lymphs Abs: 1.9 10*3/uL (ref 0.7–4.0)
MCH: 27.1 pg (ref 26.0–34.0)
MCHC: 32.9 g/dL (ref 30.0–36.0)
MCV: 82.4 fL (ref 80.0–100.0)
Monocytes Absolute: 0.5 10*3/uL (ref 0.1–1.0)
Monocytes Relative: 10 %
Neutro Abs: 2.2 10*3/uL (ref 1.7–7.7)
Neutrophils Relative %: 45 %
Platelets: 311 10*3/uL (ref 150–400)
RBC: 5.35 MIL/uL — ABNORMAL HIGH (ref 3.87–5.11)
RDW: 13.8 % (ref 11.5–15.5)
WBC: 4.8 10*3/uL (ref 4.0–10.5)
nRBC: 0 % (ref 0.0–0.2)

## 2023-08-22 LAB — BASIC METABOLIC PANEL WITH GFR
Anion gap: 10 (ref 5–15)
BUN: 14 mg/dL (ref 8–23)
CO2: 22 mmol/L (ref 22–32)
Calcium: 9.2 mg/dL (ref 8.9–10.3)
Chloride: 104 mmol/L (ref 98–111)
Creatinine, Ser: 0.8 mg/dL (ref 0.44–1.00)
GFR, Estimated: >60 mL/min (ref >60)
Glucose, Bld: 118 mg/dL — ABNORMAL HIGH (ref 70–99)
Potassium: 4.5 mmol/L (ref 3.5–5.1)
Sodium: 136 mmol/L (ref 135–145)

## 2023-08-22 MED ORDER — AMOXICILLIN-POT CLAVULANATE ER 1000-62.5 MG PO TB12: 2.0000 | ORAL_TABLET | Freq: Two times a day (BID) | ORAL | 0 refills | Status: AC

## 2023-08-22 MED ORDER — PROCHLORPERAZINE EDISYLATE 10 MG/2ML IJ SOLN
5.0000 mg | Freq: Once | INTRAMUSCULAR | Status: AC
  Administered 2023-08-22: 5 mg via INTRAVENOUS
  Filled 2023-08-22: qty 2

## 2023-08-22 MED ORDER — DEXAMETHASONE SODIUM PHOSPHATE 10 MG/ML IJ SOLN
10.0000 mg | Freq: Once | INTRAMUSCULAR | Status: AC
  Administered 2023-08-22: 10 mg via INTRAVENOUS
  Filled 2023-08-22: qty 1

## 2023-08-22 MED ORDER — SODIUM CHLORIDE 0.9 % IV BOLUS
500.0000 mL | Freq: Once | INTRAVENOUS | Status: AC
  Administered 2023-08-22: 500 mL via INTRAVENOUS

## 2023-08-22 MED ORDER — DIPHENHYDRAMINE HCL 50 MG/ML IJ SOLN
12.5000 mg | Freq: Once | INTRAMUSCULAR | Status: AC
  Administered 2023-08-22: 12.5 mg via INTRAVENOUS
  Filled 2023-08-22: qty 1

## 2023-08-22 MED ORDER — ACETAMINOPHEN 325 MG PO TABS
650.0000 mg | ORAL_TABLET | Freq: Once | ORAL | Status: DC
  Filled 2023-08-22: qty 2

## 2023-08-22 MED ORDER — ACETAMINOPHEN 160 MG/5ML PO SOLN
650.0000 mg | Freq: Once | ORAL | Status: AC
  Administered 2023-08-22: 650 mg via ORAL
  Filled 2023-08-22: qty 20.3

## 2023-08-22 NOTE — ED Notes (Signed)
 Pt sent pov to [facility] for ed to ed transfer to get CT

## 2023-08-22 NOTE — ED Provider Notes (Signed)
 Physical Exam  BP (!) 154/78 (BP Location: Left Arm)   Pulse 67   Temp 97.7 F (36.5 C) (Oral)   Resp 16   Ht 5\' 2"  (1.575 m)   Wt 77.1 kg   SpO2 100%   BMI 31.09 kg/m   Physical Exam  Procedures  Procedures  ED Course / MDM    Medical Decision Making Amount and/or Complexity of Data Reviewed Labs: ordered. Radiology: ordered.  Risk OTC drugs. Prescription drug management.   Patient was sent over from [facility] ER for image scanning given that their CT machine was down.  Patient reports this feels similar her migraines are severe however it is more right-sided which is typical of her migraines.  Having some nausea but no vomiting.  Reports some postnasal drip with some runny nose and a congestion.  Takes her montelukast  but has not been taking any Claritin.  Denies any vision changes or trouble walking or talking.  No sudden onset of headache.  Reports that usually she suffers from sinus factions and has had even surgery to improve this before.  She did not use any nasal sprays given that they trigger her migraines.  She reports that usually she feels better off the Augmentin however does not seem to be getting better however denies that she is getting worse.  I discussed with the patient this may have some migrainous component to it and she is agreeable to doing a migraine cocktail test for improvement.  Will give her Compazine, Decadron , Benadryl , and small amount of IV fluids and will reassess  Vital signs show mildly elevated blood pressure otherwise unremarkable.  Differential diagnosis includes but is not limited to Stroke, increased ICP, meningitis, CVA, intracranial tumor, venous sinus thrombosis, migraine, cluster headache, hypertension, drug related, head injury, tension headache, sinusitis, dental abscess, otitis media, TMJ, GCA.   CT imaging shows  1. No acute intracranial findings. 2. Complete opacification of the right maxillary sinus and ostiomeatal complex with  bulbous extension into the right nasal airway abutting the inferior turbinate as this is improved compared to the prior exam. Findings likely due to chronic sinusitis with mucous retention cyst. 3. Very minimal mucosal membrane thickening over the left side of the frontal sinus. 4. 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 This is nonspecific but may be due to a benign process such as fibrous dysplasia and less likely metastatic disease. Recommend clinical correlation. Per radiologist's interpretation.    I independently reviewed and interpreted the patient's labs.  CBC without leukocytosis or anemia.  BMP shows glucose at 118 otherwise no electrolyte abnormality.  On reevaluation, patient reports that she is feeling much better.  Glad that the migraine cocktail sneezy working for her.  She is not denies any nausea.  Might be having some nausea due to migraine or possible postnasal drip causing this.  I have instructed for her to keep with the Augmentin however if she does not feel better in the next 48 hours that she can pick up the new prescription for Augmentin at a stronger dose.  She verbalizes her understanding.  She will use the normal saline rinses that she is use prior.  She also reports that she will follow-up with her nose and throat doctor.  We discussed the fibrous changes seen to the school and recommended follow-up with her PCP on this.  She verbalized her understanding.  She does not have any tenderness to the temples,  mildly elevated blood pressure otherwise hemodynamically stable.  Do not think this is any GCA or stroke.  She stable for discharge home with outpatient follow-up.  We discussed the results of the labs/imaging. The plan is take medications, supportive care . We discussed strict return precautions and red flag symptoms. The patient verbalized their understanding and agrees to the plan. The patient is stable and  being discharged home in good condition.  Portions of this report may have been transcribed using voice recognition software. Every effort was made to ensure accuracy; however, inadvertent computerized transcription errors may be present.    Results for orders placed or performed during the hospital encounter of 08/22/23  CBC with Differential   Collection Time: 08/22/23 10:32 AM  Result Value Ref Range   WBC 4.8 4.0 - 10.5 K/uL   RBC 5.35 (H) 3.87 - 5.11 MIL/uL   Hemoglobin 14.5 12.0 - 15.0 g/dL   HCT 16.1 09.6 - 04.5 %   MCV 82.4 80.0 - 100.0 fL   MCH 27.1 26.0 - 34.0 pg   MCHC 32.9 30.0 - 36.0 g/dL   RDW 40.9 81.1 - 91.4 %   Platelets 311 150 - 400 K/uL   nRBC 0.0 0.0 - 0.2 %   Neutrophils Relative % 45 %   Neutro Abs 2.2 1.7 - 7.7 K/uL   Lymphocytes Relative 39 %   Lymphs Abs 1.9 0.7 - 4.0 K/uL   Monocytes Relative 10 %   Monocytes Absolute 0.5 0.1 - 1.0 K/uL   Eosinophils Relative 4 %   Eosinophils Absolute 0.2 0.0 - 0.5 K/uL   Basophils Relative 2 %   Basophils Absolute 0.1 0.0 - 0.1 K/uL   Immature Granulocytes 0 %   Abs Immature Granulocytes 0.01 0.00 - 0.07 K/uL  Basic metabolic panel   Collection Time: 08/22/23 10:32 AM  Result Value Ref Range   Sodium 136 135 - 145 mmol/L   Potassium 4.5 3.5 - 5.1 mmol/L   Chloride 104 98 - 111 mmol/L   CO2 22 22 - 32 mmol/L   Glucose, Bld 118 (H) 70 - 99 mg/dL   BUN 14 8 - 23 mg/dL   Creatinine, Ser 7.82 0.44 - 1.00 mg/dL   Calcium 9.2 8.9 - 95.6 mg/dL   GFR, Estimated >21 >30 mL/min   Anion gap 10 5 - 15   CT Head Wo Contrast Result Date: 08/22/2023 CLINICAL DATA:  Headache and nausea 4 days as tender antibiotics 4 days ago for sinus infection. History of migraines. Mid facial pain. EXAM: CT HEAD WITHOUT CONTRAST CT MAXILLOFACIAL WITHOUT CONTRAST TECHNIQUE: Multidetector CT imaging of the head and maxillofacial structures were performed using the standard protocol without intravenous contrast. Multiplanar CT image  reconstructions of the maxillofacial structures were also generated. RADIATION DOSE REDUCTION: This exam was performed according to the departmental dose-optimization program which includes automated exposure control, adjustment of the mA and/or kV according to patient size and/or use of iterative reconstruction technique. COMPARISON:  Maxillofacial CT 11/03/2021 FINDINGS: CT HEAD FINDINGS Brain: Ventricles, cisterns and other CSF spaces are normal. No mass, mass effect, shift of midline structures or acute hemorrhage. No evidence of acute infarction. Vascular: No hyperdense vessel or unexpected calcification. 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 Other: None. CT MAXILLOFACIAL FINDINGS Osseous: No acute fracture. Mild sclerosis over the left frontal skull just above the sinus unchanged with minimal adjacent lucency just inferolateral to the sclerosis as  this is also unchanged. Orbits: Negative. No traumatic or inflammatory finding. Sinuses: Paranasal sinuses well developed. There is complete opacification of the right maxillary sinus and ostiomeatal complex with bulbous extension into the right nasal airway abutting the inferior turbinate as this is improved compared to the prior exam. Very minimal mucosal membrane thickening over the left side of the frontal sinus. Left ostiomeatal complex is patent. Subtle deviation of the nasal septum to the left. Mastoid air cells are clear. Soft tissues: Negative. IMPRESSION: 1. No acute intracranial findings. 2. Complete opacification of the right maxillary sinus and ostiomeatal complex with bulbous extension into the right nasal airway abutting the inferior turbinate as this is improved compared to the prior exam. Findings likely due to chronic sinusitis with mucous retention cyst. 3. Very minimal mucosal membrane thickening over the left side of the frontal sinus. 4. 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 This is nonspecific but may be due to a benign process such as fibrous dysplasia and less likely metastatic disease. Recommend clinical correlation Electronically Signed   By: Roda Cirri M.D.   On: 08/22/2023 11:37   CT Maxillofacial Wo Contrast Result Date: 08/22/2023 CLINICAL DATA:  Headache and nausea 4 days as tender antibiotics 4 days ago for sinus infection. History of migraines. Mid facial pain. EXAM: CT HEAD WITHOUT CONTRAST CT MAXILLOFACIAL WITHOUT CONTRAST TECHNIQUE: Multidetector CT imaging of the head and maxillofacial structures were performed using the standard protocol without intravenous contrast. Multiplanar CT image reconstructions of the maxillofacial structures were also generated. RADIATION DOSE REDUCTION: This exam was performed according to the departmental dose-optimization program which includes automated exposure control, adjustment of the mA and/or kV according to patient size and/or use of iterative reconstruction technique. COMPARISON:  Maxillofacial CT 11/03/2021 FINDINGS: CT HEAD FINDINGS Brain: Ventricles, cisterns and other CSF spaces are normal. No mass, mass effect, shift of midline structures or acute hemorrhage. No evidence of acute infarction. Vascular: No hyperdense vessel or unexpected calcification. 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 Other: None. CT MAXILLOFACIAL FINDINGS Osseous: No acute fracture. Mild sclerosis over the left frontal skull just above the sinus unchanged with minimal adjacent lucency just inferolateral to the sclerosis as this is also unchanged. Orbits: Negative. No traumatic or inflammatory finding. Sinuses: Paranasal sinuses well developed. There is complete opacification of the right maxillary sinus and ostiomeatal complex with bulbous extension into the right nasal airway  abutting the inferior turbinate as this is improved compared to the prior exam. Very minimal mucosal membrane thickening over the left side of the frontal sinus. Left ostiomeatal complex is patent. Subtle deviation of the nasal septum to the left. Mastoid air cells are clear. Soft tissues: Negative. IMPRESSION: 1. No acute intracranial findings. 2. Complete opacification of the right maxillary sinus and ostiomeatal complex with bulbous extension into the right nasal airway abutting the inferior turbinate as this is improved compared to the prior exam. Findings likely due to chronic sinusitis with mucous retention cyst. 3. Very minimal mucosal membrane thickening over the left side of the frontal sinus. 4. 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 This is nonspecific but may be due to a benign process such as fibrous dysplasia and less likely metastatic disease. Recommend clinical correlation Electronically Signed   By: Roda Cirri M.D.   On:  08/22/2023 11:37      Spence Dux, PA-C 08/22/23 1251    Iva Mariner, MD 08/22/23 [phone]

## 2023-08-22 NOTE — Discharge Instructions (Addendum)
 You were seen in the emerged from today for evaluation of your headache and sinus pressure.  And glad that you are feeling better.  Please make sure that you are staying well-hydrated and plenty of fluids, mainly water.  For your pain you can take Tylenol  1000 mg every 6 hours as needed.  I also recommend trying a normal saline rinses again and also trying Claritin to help you with your seasonal allergy symptoms.  I would like for you to continue your Augmentin however if you do not feel better in the next 24 to 48 hours you can pick up the new prescription I sent you for a stronger dose of Augmentin.  If you do start feeling better however you can disregard the new prescription.  Please make sure that you are following up with your ear nose and throat provider as you will need reevaluation for your sinus issues.  If you have any concerns, new or worsening symptoms, please return to your nearest emergency room for evaluation.  Contact a doctor if: You have a fever. Your symptoms get worse. Your symptoms do not get better within 10 days. Get help right away if: You have a very bad headache. You cannot stop vomiting. You have very bad pain or swelling around your face or eyes. You have trouble seeing. You feel confused. Your neck is stiff. You have trouble breathing. These symptoms may be an emergency. Get help right away. Call 911. Do not wait to see if the symptoms will go away. Do not drive yourself to the hospital.

## 2023-08-22 NOTE — ED Provider Notes (Signed)
 [place] EMERGENCY DEPARTMENT AT [facility] Provider Note   CSN: [id number] Arrival date & time: 08/22/23  5284     History  Chief Complaint  Patient presents with   Headache    Jacqueline Fischer is a 68 y.o. female.  68 year old female presents with headache and nausea x 4 days.  Patient is currently being treated for sinusitis for the past 3 days.  No fever or chills.  No neck pain.  Does have a history of migraines and states this is different.  Denies any emesis.  Does note some postnasal drainage.  Has mid facial pain.  Similar to her prior sinusitis in the past.  She is currently on Augmentin       Home Medications Prior to Admission medications   Medication Sig Start Date End Date Taking? Authorizing Provider  amoxicillin -clavulanate (AUGMENTIN) 250-62.5 MG/5ML suspension Take by mouth. 08/20/23  Yes [provider]  almotriptan (AXERT) 12.5 MG tablet Take 12.5 mg by mouth as needed for migraine. may repeat in 2 hours if needed    [provider]  Ascorbic Acid (VITAMIN C PO) Take by mouth.    [provider]  Beclomethasone Dipropionate  (QNASL ) 80 MCG/ACT AERS Use one to two puffs in each nostril once daily. Patient not taking: Reported on 03/18/2023 07/28/18   Kozlow, Eric J, MD  Cholecalciferol (VITAMIN D PO) Take by mouth.    [provider]  montelukast  (SINGULAIR ) 10 MG tablet Take 1 tablet (10 mg total) by mouth at bedtime. 07/22/20   Tinnie Forehand, FNP      Allergies    Hydrocodone, Oxycodone , and Tetracyclines & related    Review of Systems   Review of Systems  All other systems reviewed and are negative.   Physical Exam Updated Vital Signs BP (!) 148/82 (BP Location: Right Arm)   Pulse 79   Temp 98 F (36.7 C) (Oral)   Resp 20   Ht 1.575 m (5\' 2" )   Wt 77.1 kg   SpO2 98%   BMI 31.09 kg/m  Physical Exam Vitals and nursing note reviewed.  Constitutional:      General: She is not in acute distress.     Appearance: Normal appearance. She is well-developed. She is not toxic-appearing.  HENT:     Head: Normocephalic and atraumatic.  Eyes:     General: Lids are normal.     Conjunctiva/sclera: Conjunctivae normal.     Pupils: Pupils are equal, round, and reactive to light.  Neck:     Thyroid : No thyroid  mass.     Trachea: No tracheal deviation.  Cardiovascular:     Rate and Rhythm: Normal rate and regular rhythm.     Heart sounds: Normal heart sounds. No murmur heard.    No gallop.  Pulmonary:     Effort: Pulmonary effort is normal. No respiratory distress.     Breath sounds: Normal breath sounds. No stridor. No decreased breath sounds, wheezing, rhonchi or rales.  Abdominal:     General: There is no distension.     Palpations: Abdomen is soft.     Tenderness: There is no abdominal tenderness. There is no rebound.  Musculoskeletal:        General: No tenderness. Normal range of motion.     Cervical back: Normal range of motion and neck supple.  Skin:    General: Skin is warm and dry.     Findings: No abrasion or rash.  Neurological:     Mental  Status: She is alert and oriented to person, place, and time. Mental status is at baseline.     GCS: GCS eye subscore is 4. GCS verbal subscore is 5. GCS motor subscore is 6.     Cranial Nerves: No cranial nerve deficit.     Sensory: No sensory deficit.     Motor: Motor function is intact.  Psychiatric:        Attention and Perception: Attention normal.        Speech: Speech normal.        Behavior: Behavior normal.     ED Results / Procedures / Treatments   Labs (all labs ordered are listed, but only abnormal results are displayed) Labs Reviewed - No data to display  EKG None  Radiology No results found.  Procedures Procedures    Medications Ordered in ED Medications  acetaminophen  (TYLENOL ) 160 MG/5ML solution 650 mg (has no administration in time range)    ED Course/ Medical Decision Making/ A&P                                  Medical Decision Making Risk OTC drugs.   Patient given Tylenol  for pain here.  I informed her that the cause of her symptoms are likely a viral sinusitis.  She is frustrated that the antibiotics have not worked as quickly.  She is requesting a CT for further evaluation.  Unfortunately our CT scanner is down.  Will send her to [facility] to have this study done.  She will go by private vehicle        Final Clinical Impression(s) / ED Diagnoses Final diagnoses:  None    Rx / DC Orders ED Discharge Orders     None         Lind Repine, MD 08/22/23 [phone]

## 2023-08-22 NOTE — ED Triage Notes (Signed)
 Pt Jacqueline Fischer, ambulatory c/o headache and nausea x4 days further reporting was started on abx Wed for sinus infection. PMH migraines, states this feels different.

## 2023-08-22 NOTE — ED Notes (Signed)
 Patient arrived from [facility] for CT scan.

## 2023-09-01 ENCOUNTER — Ambulatory Visit (INDEPENDENT_AMBULATORY_CARE_PROVIDER_SITE_OTHER): Admitting: Otolaryngology

## 2023-09-01 ENCOUNTER — Encounter (INDEPENDENT_AMBULATORY_CARE_PROVIDER_SITE_OTHER): Payer: Self-pay | Admitting: Otolaryngology

## 2023-09-01 VITALS — BP 147/80 | HR 85 | Ht 62.0 in | Wt 172.0 lb

## 2023-09-01 DIAGNOSIS — R519 Headache, unspecified: Secondary | ICD-10-CM

## 2023-09-01 DIAGNOSIS — J320 Chronic maxillary sinusitis: Secondary | ICD-10-CM

## 2023-09-01 DIAGNOSIS — J338 Other polyp of sinus: Secondary | ICD-10-CM

## 2023-09-01 MED ORDER — PREDNISONE 10 MG (48) PO TBPK: ORAL_TABLET | ORAL | 0 refills | Status: DC

## 2023-09-04 DIAGNOSIS — R519 Headache, unspecified: Secondary | ICD-10-CM | POA: Insufficient documentation

## 2023-09-04 DIAGNOSIS — J320 Chronic maxillary sinusitis: Secondary | ICD-10-CM | POA: Insufficient documentation

## 2023-09-04 NOTE — Progress Notes (Signed)
 Patient ID: Jacqueline Fischer, female   DOB: [birth year], 68 y.o.   MRN: [id number]  Follow-up chronic sinusitis, right facial pressure  HPI: The patient is a 68 year old female who returns today complaining of persistent right facial pain for the past 2 weeks.  The patient has a history of chronic right maxillary, ethmoid, and frontal sinusitis and polyposis.  She underwent endoscopic sinus surgery in 2023.  The patient returns today complaining of severe headaches and right-sided facial pressure for the past 2 weeks.  She was seen at the [facility] emergency room.  Her CT scan on Aug 22, 2023 showed complete opacification of the right maxillary sinus and ostiomeatal complex, with soft tissue extension into the right nasal airway.  The patient was treated with Augmentin  and azithromycin.  Despite the treatment, she continues to be symptomatic.  Exam: General: Communicates without difficulty, well nourished, no acute distress. Head: Normocephalic, no evidence injury, no tenderness, facial buttresses intact without stepoff. Face/sinus: No tenderness to palpation and percussion. Facial movement is normal and symmetric. Eyes: PERRL, EOMI. No scleral icterus, conjunctivae clear. Neuro: CN II exam reveals vision grossly intact.  No nystagmus at any point of gaze. Ears: Auricles well formed without lesions.  Ear canals are intact without mass or lesion.  No erythema or edema is appreciated.  The TMs are intact without fluid. Nose: External evaluation reveals normal support and skin without lesions.  Dorsum is intact.  Anterior rhinoscopy reveals congested mucosa over anterior aspect of inferior turbinates and intact septum.  Polypoid tissue is noted within the right middle meatus.  Oral:  Oral cavity and oropharynx are intact, symmetric, without erythema or edema.  Mucosa is moist without lesions. Neck: Full range of motion without pain.  There is no significant lymphadenopathy.  No masses palpable.  Thyroid  bed  within normal limits to palpation.  Parotid glands and submandibular glands equal bilaterally without mass.  Trachea is midline. Neuro:  CN 2-12 grossly intact.   Assessment: 1.  Chronic right maxillary sinusitis and polyposis. 2.  Chronic headaches and right-sided facial pressure. 3.  She was recently treated with 2 courses of antibiotics.  Plan: 1.  The physical exam findings and the CT images are extensively reviewed with the patient. 2.  High-dose prednisone  for 12 days.  The possible side effects are discussed. 3.  Nasal saline irrigation daily. 4.  The patient will return for reevaluation in 3 weeks.  If she continues to be symptomatic, she may benefit from either surgical intervention or Dupixent injection.

## 2023-09-23 ENCOUNTER — Ambulatory Visit (INDEPENDENT_AMBULATORY_CARE_PROVIDER_SITE_OTHER): Payer: BC Managed Care – PPO | Admitting: Otolaryngology

## 2023-09-23 ENCOUNTER — Encounter (INDEPENDENT_AMBULATORY_CARE_PROVIDER_SITE_OTHER): Payer: Self-pay | Admitting: Otolaryngology

## 2023-09-23 VITALS — BP 142/82 | HR 90 | Ht 61.0 in | Wt 172.0 lb

## 2023-09-23 DIAGNOSIS — J338 Other polyp of sinus: Secondary | ICD-10-CM | POA: Diagnosis not present

## 2023-09-23 DIAGNOSIS — J320 Chronic maxillary sinusitis: Secondary | ICD-10-CM | POA: Diagnosis not present

## 2023-09-23 NOTE — Progress Notes (Signed)
 Patient ID: Jacqueline Fischer, female   DOB: [birth year], 68 y.o.   MRN: [id number]  Follow-up: Chronic right maxillary sinusitis and polyposis  HPI: The patient is a 68 year old female who returns today for her follow-up evaluation.  She was last seen 3 weeks ago.  At that time, she was complaining of chronic right-sided facial pain and pressure.  Her CT scan showed complete opacification of the right maxillary sinus and ostiomeatal complex, with polypoid tissue extending into the right nasal cavity.  The patient was treated with 2 courses of antibiotic as well as high-dose prednisone  for 12 days.  She returns today reporting resolution of her right facial pain and headaches.  Her nasal breathing has also improved.  Currently she denies any fever or visual change.  Exam: General: Communicates without difficulty, well nourished, no acute distress. Head: Normocephalic, no evidence injury, no tenderness, facial buttresses intact without stepoff. Face/sinus: No tenderness to palpation and percussion. Facial movement is normal and symmetric. Eyes: PERRL, EOMI. No scleral icterus, conjunctivae clear. Neuro: CN II exam reveals vision grossly intact.  No nystagmus at any point of gaze. Ears: Auricles well formed without lesions.  Ear canals are intact without mass or lesion.  No erythema or edema is appreciated.  The TMs are intact without fluid. Nose: External evaluation reveals normal support and skin without lesions.  Dorsum is intact.  Anterior rhinoscopy reveals congested mucosa over anterior aspect of inferior turbinates and intact septum.  No purulence noted.  A small amount of polypoid tissue is noted within the right nasal cavity.  Oral:  Oral cavity and oropharynx are intact, symmetric, without erythema or edema.  Mucosa is moist without lesions. Neck: Full range of motion without pain.  There is no significant lymphadenopathy.  No masses palpable.  Thyroid  bed within normal limits to palpation.  Parotid  glands and submandibular glands equal bilaterally without mass.  Trachea is midline. Neuro:  CN 2-12 grossly intact.   Assessment: 1.  The patient's chronic right maxillary sinusitis and polyposis have significantly improved. 2.  Only a small amount of polypoid tissue is noted within the right nasal cavity and the maxillary antrum.  Plan: 1.  The physical exam findings are reviewed with the patient. 2.  Sensimist nasal spray 2 sprays each nostril daily.  The importance of consistent daily use is discussed. 3.  The options of revision sinus surgery versus Dupixent injection are discussed with the patient. 4.  The patient is interested in continuing with medical treatment for now. 5.  The patient will return for reevaluation in 2 months.

## 2023-09-28 ENCOUNTER — Other Ambulatory Visit: Payer: Self-pay | Admitting: Nurse Practitioner

## 2023-09-28 DIAGNOSIS — E042 Nontoxic multinodular goiter: Secondary | ICD-10-CM

## 2023-10-05 ENCOUNTER — Emergency Department (HOSPITAL_BASED_OUTPATIENT_CLINIC_OR_DEPARTMENT_OTHER)
Admission: EM | Admit: 2023-10-05 | Discharge: 2023-10-05 | Disposition: A | Discharge status: Home / Self Care | Attending: Emergency Medicine | Admitting: Emergency Medicine

## 2023-10-05 ENCOUNTER — Encounter (HOSPITAL_BASED_OUTPATIENT_CLINIC_OR_DEPARTMENT_OTHER): Payer: Self-pay | Admitting: Emergency Medicine

## 2023-10-05 ENCOUNTER — Other Ambulatory Visit: Payer: Self-pay

## 2023-10-05 ENCOUNTER — Emergency Department (HOSPITAL_BASED_OUTPATIENT_CLINIC_OR_DEPARTMENT_OTHER)

## 2023-10-05 DIAGNOSIS — W228XXA Striking against or struck by other objects, initial encounter: Secondary | ICD-10-CM | POA: Diagnosis not present

## 2023-10-05 DIAGNOSIS — E119 Type 2 diabetes mellitus without complications: Secondary | ICD-10-CM | POA: Diagnosis not present

## 2023-10-05 DIAGNOSIS — M79674 Pain in right toe(s): Secondary | ICD-10-CM | POA: Diagnosis present

## 2023-10-05 DIAGNOSIS — Y9302 Activity, running: Secondary | ICD-10-CM | POA: Diagnosis not present

## 2023-10-05 DIAGNOSIS — S90121A Contusion of right lesser toe(s) without damage to nail, initial encounter: Secondary | ICD-10-CM | POA: Insufficient documentation

## 2023-10-05 HISTORY — DX: Disorder of thyroid, unspecified: E07.9

## 2023-10-05 NOTE — Discharge Instructions (Signed)
 Your x-ray did not show any fracture or dislocation.  It appears you have a bone bruise that is causing your pain and swelling.  You are providing a walking boot to wear when walking to help with the pain.  Please wean out of this boot as tolerated.  Avoid movements and activities that are painful in the first couple of days after the injury. Elevate the area of injury and wrap with an elastic bandage or tape to help with swelling.  You may take up to 1000mg  of tylenol  every 6 hours as needed for pain. Do not take more then 4g per day.   You may use up to 600mg  ibuprofen  (Motrin ) every 6 hours as needed for pain.  Do not exceed 2.4g of ibuprofen  per day.  Gradually return to activity as pain allows. Try to engage in non-painful types of physical activity/exercise to increase blood flow to your area of injury.  Please follow-up with your PCP if symptoms or not starting to improve within the next week.  Return to the ER if you have any complete numbness of your foot, pain not controlled Tylenol  or ibuprofen , any other new or concerning symptoms

## 2023-10-05 NOTE — ED Triage Notes (Signed)
 Pt via pov from home with right foot pain after running into someone with steel toed boots on. Pt reports  that her 3-5th toe and upper foot all hurt; unable to bear weight. Reports foot is swollen as well. Pt alert & oriented, nad noted.

## 2023-10-05 NOTE — ED Provider Notes (Signed)
 [place] EMERGENCY DEPARTMENT AT [facility] Provider Note   CSN: [id number] Arrival date & time: 10/05/23  1436     Patient presents with: Foot Pain   Jacqueline Fischer is a 68 y.o. female with history of type 2 diabetes, presents with concern for pain to her right fourth toe after hitting it against someone with steel toed boots on.  She denies any numbness or tingling in her foot.  She reports difficulty bearing weight on the foot.  Reports increased swelling to her foot.    Foot Pain       Prior to Admission medications   Medication Sig Start Date End Date Taking? Authorizing Provider  almotriptan (AXERT) 12.5 MG tablet Take 12.5 mg by mouth as needed for migraine. may repeat in 2 hours if needed    [provider]  Ascorbic Acid (VITAMIN C PO) Take by mouth.    [provider]  Beclomethasone Dipropionate  (QNASL ) 80 MCG/ACT AERS Use one to two puffs in each nostril once daily. 07/28/18   Kozlow, Eric J, MD  Cholecalciferol (VITAMIN D PO) Take by mouth.    [provider]  montelukast  (SINGULAIR ) 10 MG tablet Take 1 tablet (10 mg total) by mouth at bedtime. 07/22/20   Cheryl Reusing, FNP  predniSONE  (STERAPRED UNI-PAK 48 TAB) 10 MG (48) TBPK tablet Per instructions (6,6,6,6,4,4,4,4,2,2,2,2) 09/01/23   Karis Clunes, MD    Allergies: Fluticasone, Tetracycline hcl, Hydrocodone, Oxycodone , Tetracycline, and Tetracyclines & related    Review of Systems  Musculoskeletal:        Right foot pain    Updated Vital Signs BP (!) 145/89   Pulse 82   Temp 97.9 F (36.6 C)   Resp 17   Ht 5' 1 (1.549 m)   Wt 77.1 kg   SpO2 97%   BMI 32.12 kg/m   Physical Exam Vitals and nursing note reviewed.  Constitutional:      Appearance: Normal appearance.  HENT:     Head: Atraumatic.  Cardiovascular:     Comments: Pedal pulses 2+ on the right lower extremity Pulmonary:     Effort: Pulmonary effort is normal.  Musculoskeletal:     Comments: Right  lower extremity:  General Dorsum of the right foot with.  No obvious deformity, no open wounds or ecchymoses  Palpation Tender of the distal fourth metatarsal and fourth phalanges.  Mildly tender over the fifth phalange.   Nontender over the 1st through 3rd metatarsals or phalanges.  Nontender along the tibia and fibula Nontender on the lateral and medial malleolus Non-tender along the ATFL, CFL, PTFL, achilles tendon  ROM Full ankle flexion, extension, inversion and eversion Able to wiggle all toes  Sensation: Sensation intact throughout the lower extremity   Neurological:     General: No focal deficit present.     Mental Status: She is alert.  Psychiatric:        Mood and Affect: Mood normal.        Behavior: Behavior normal.     (all labs ordered are listed, but only abnormal results are displayed) Labs Reviewed - No data to display  EKG: None  Radiology: DG Foot Complete Right Result Date: 10/05/2023 CLINICAL DATA:  [id number] Injury [id number], right foot pain EXAM: RIGHT FOOT COMPLETE - 3+ VIEW COMPARISON:  None Available. FINDINGS: No acute fracture or dislocation. Small undersurface calcaneal heel spur. There is no evidence of arthropathy or other focal bone abnormality. Soft tissues are unremarkable. IMPRESSION: No acute fracture or  dislocation. Electronically Signed   By: Rogelia Myers M.D.   On: 10/05/2023 16:30     Procedures   Medications Ordered in the ED - No data to display                                  Medical Decision Making Amount and/or Complexity of Data Reviewed Radiology: ordered.     Differential diagnosis includes but is not limited to fracture, dislocation, contusion, sprain  ED Course:  Upon initial evaluation, patient is well-appearing, no acute distress.  On exam, has tenderness over the distal fourth metatarsal of the right lower extremity as well as 4th and 5th phalanges.  There is mild overlying edema.  No open wounds or obvious  deformity.  She is neurovascularly intact in the right lower extremity.  Able to wiggle all toes and has full range of motion of the right ankle.  X-ray of the right foot without acute abnormality noted.  I have low concern for ankle sprain given location of pain. She reports difficulty walking on the right foot due to pain.  Provided cam walking boot for her to use.  We discussed that she needs to wean out of the boot as tolerated.  Stable and appropriate for discharge home   Imaging Studies ordered: I ordered imaging studies including x-ray right foot I independently visualized the imaging with scope of interpretation limited to determining acute life threatening conditions related to emergency care. Imaging showed no acute abnormality I agree with the radiologist interpretation  Medications Given: None   Impression: Right 4th phalanx contusion   Disposition:  The patient was discharged home with instructions to wear cam boot as needed, wean out of boot as tolerated.  Ice and elevate foot to help with swelling and pain.  Tylenol  and ibuprofen  as needed for pain.  Follow-up with PCP if pain not improving within the next week. Return precautions given.       This chart was dictated using voice recognition software, Dragon. Despite the best efforts of this provider to proofread and correct errors, errors may still occur which can change documentation meaning.       Final diagnoses:  Contusion of fourth toe of right foot, initial encounter    ED Discharge Orders     None          Veta Palma, PA-C 10/05/23 1857    Ruthe Cornet, DO 10/05/23 1943

## 2023-11-24 ENCOUNTER — Ambulatory Visit (INDEPENDENT_AMBULATORY_CARE_PROVIDER_SITE_OTHER): Admitting: Otolaryngology

## 2023-12-02 ENCOUNTER — Encounter (INDEPENDENT_AMBULATORY_CARE_PROVIDER_SITE_OTHER): Payer: Self-pay | Admitting: Otolaryngology

## 2023-12-02 ENCOUNTER — Ambulatory Visit (INDEPENDENT_AMBULATORY_CARE_PROVIDER_SITE_OTHER): Admitting: Otolaryngology

## 2023-12-02 VITALS — BP 130/76 | HR 83

## 2023-12-02 DIAGNOSIS — J320 Chronic maxillary sinusitis: Secondary | ICD-10-CM

## 2023-12-02 DIAGNOSIS — J338 Other polyp of sinus: Secondary | ICD-10-CM | POA: Diagnosis not present

## 2023-12-04 NOTE — Progress Notes (Signed)
 Patient ID: Jacqueline Fischer, female   DOB: June 03, 1955, 68 y.o.   MRN: [id number]  Follow-up: Chronic right maxillary sinusitis and polyposis  HPI: The patient is a 68 year old female who returns today for follow-up evaluation.  The patient has a history of chronic right maxillary sinusitis and polyposis.  She underwent right-sided endoscopic sinus surgery in November 2023.  Her sinus CT earlier this year showed complete opacification of the right maxillary sinus and ostiomeatal complex, with polypoid tissue extending into the right nasal cavity.  The patient was treated with antibiotics and high-dose prednisone .  The infection resolved after the antibiotic treatment.  However, she continues to have a small amount of polypoid tissue within the right maxillary sinus and the right nasal cavity.  The patient returns today complaining of occasional right-sided facial pressure and nasal drainage.  She is currently on Sensimist nasal spray.  She denies any fever or visual change.  Exam: General: Communicates without difficulty, well nourished, no acute distress. Head: Normocephalic, no evidence injury, no tenderness, facial buttresses intact without stepoff. Face/sinus: No tenderness to palpation and percussion. Facial movement is normal and symmetric. Eyes: PERRL, EOMI. No scleral icterus, conjunctivae clear. Neuro: CN II exam reveals vision grossly intact.  No nystagmus at any point of gaze. Ears: Auricles well formed without lesions.  Ear canals are intact without mass or lesion.  No erythema or edema is appreciated.  The TMs are intact without fluid. Nose: External evaluation reveals normal support and skin without lesions.  Dorsum is intact.  Anterior rhinoscopy reveals congested mucosa over anterior aspect of inferior turbinates and intact septum.  No purulence noted.  A small amount of polypoid tissue is noted within the inferior nasal cavity and maxillary antrum.  Oral:  Oral cavity and oropharynx are  intact, symmetric, without erythema or edema.  Mucosa is moist without lesions. Neck: Full range of motion without pain.  There is no significant lymphadenopathy.  No masses palpable.  Thyroid  bed within normal limits to palpation.  Parotid glands and submandibular glands equal bilaterally without mass.  Trachea is midline. Neuro:  CN 2-12 grossly intact.   Assessment: 1.  Chronic right maxillary sinusitis and polyposis. 2.  No acute infection is noted today.  A small amount of polypoid tissue is noted within the inferior right nasal cavity and the maxillary antrum.  Plan: 1.  The physical exam findings are reviewed with the patient. 2.  Continue with Sensimist nasal spray 2 sprays each nostril daily.  The importance of consistent daily use is discussed. 3.  The option of Dupixent injection to treat her recurrent polyposis is discussed. 4.  The patient would like to consider her options.  She will return for reevaluation in 3 months.

## 2024-03-01 ENCOUNTER — Ambulatory Visit (INDEPENDENT_AMBULATORY_CARE_PROVIDER_SITE_OTHER): Admitting: Otolaryngology

## 2024-03-01 ENCOUNTER — Encounter (INDEPENDENT_AMBULATORY_CARE_PROVIDER_SITE_OTHER): Payer: Self-pay | Admitting: Otolaryngology

## 2024-03-01 VITALS — BP 129/79 | HR 77 | Temp 98.2°F | Ht 61.5 in | Wt 174.0 lb

## 2024-03-01 DIAGNOSIS — J339 Nasal polyp, unspecified: Secondary | ICD-10-CM | POA: Diagnosis not present

## 2024-03-01 DIAGNOSIS — J320 Chronic maxillary sinusitis: Secondary | ICD-10-CM

## 2024-03-01 DIAGNOSIS — J338 Other polyp of sinus: Secondary | ICD-10-CM

## 2024-03-01 NOTE — Progress Notes (Unsigned)
 Patient ID: Jacqueline Fischer, female   DOB: 05/26/1955, 68 y.o.   MRN: [id number]  Follow up: Chronic right maxillary sinusitis and polyposis  History of Present Illness Jacqueline Fischer is a 68 year old female with nasal polyps who presents for follow-up of her nasal symptoms. The patient has a history of chronic right maxillary sinusitis and polyposis. She underwent right-sided endoscopic sinus surgery in November 2023. Her sinus CT in early 2025 showed complete opacification of the right maxillary sinus and ostiomeatal complex, with polypoid tissue extending into the right nasal cavity. The patient was treated with antibiotics and high-dose prednisone . The infection resolved after the antibiotic treatment. However, she continues to have a small amount of polypoid tissue within the right maxillary sinus and the right nasal cavity.   Currently her nasal symptoms are primarily bothersome during cold weather. She experiences no sinus infections or pain. She uses Sensimist nasal spray regularly but develops headaches if used for three consecutive days, necessitating a day off before resuming.  Currently she has no sinus infections or pain. Occasional headaches occur when using the nasal spray for three consecutive days.  Exam: General: Communicates without difficulty, well nourished, no acute distress. Head: Normocephalic, no evidence injury, no tenderness, facial buttresses intact without stepoff. Face/sinus: No tenderness to palpation and percussion. Facial movement is normal and symmetric. Eyes: PERRL, EOMI. No scleral icterus, conjunctivae clear. Neuro: CN II exam reveals vision grossly intact.  No nystagmus at any point of gaze. Ears: Auricles well formed without lesions.  Ear canals are intact without mass or lesion.  No erythema or edema is appreciated.  The TMs are intact without fluid. Nose: External evaluation reveals normal support and skin without lesions.  Dorsum is intact.  Anterior rhinoscopy  reveals congested mucosa over anterior aspect of inferior turbinates and intact septum.  No purulence noted.  A small amount of polypoid tissue is noted within the inferior nasal cavity and maxillary antrum.  Oral:  Oral cavity and oropharynx are intact, symmetric, without erythema or edema.  Mucosa is moist without lesions. Neck: Full range of motion without pain.  There is no significant lymphadenopathy.  No masses palpable.  Thyroid  bed within normal limits to palpation.  Parotid glands and submandibular glands equal bilaterally without mass.  Trachea is midline. Neuro:  CN 2-12 grossly intact.  Assessment and Plan Assessment & Plan Chronic right maxillary sinusitis with nasal polyp Chronic right maxillary sinusitis with a small nasal polyp, significantly reduced in size with Sensimist nasal spray. No sinus infection or pain reported. Occasional headaches occur if the spray is used for three consecutive days.  Dupixent injection is an alternative if the current treatment becomes ineffective. - Continue Sensimist nasal spray, two sprays in each nostril once daily. - Use saline irrigation if experiencing significant irritation, especially after outdoor exposure. - Will schedule follow-up appointment in six months.

## 2024-03-17 ENCOUNTER — Encounter: Payer: Self-pay | Admitting: Emergency Medicine

## 2024-03-17 ENCOUNTER — Ambulatory Visit
Admission: EM | Admit: 2024-03-17 | Discharge: 2024-03-17 | Disposition: A | Discharge status: Home / Self Care | Attending: Student | Admitting: Student

## 2024-03-17 DIAGNOSIS — J0101 Acute recurrent maxillary sinusitis: Secondary | ICD-10-CM

## 2024-03-17 MED ORDER — AMOXICILLIN-POT CLAVULANATE 875-125 MG PO TABS: 1.0000 | ORAL_TABLET | Freq: Two times a day (BID) | ORAL | 0 refills | Status: AC

## 2024-03-17 NOTE — Discharge Instructions (Addendum)
-  For your sinus infection, start the: -Start the antibiotic-Augmentin  (amoxicillin -clavulanate), 1 pill every 12 hours for 7 days.  You can take this with food like with breakfast and dinner. -Continue the Beclomethasone Dipropionate  (QNASL ) 80 MCG/ACT AERS nasal spray -Continue the Mucinex if it is helping -Follow-up if symptoms persist, or new symptoms like cough productive of dark or thick sputum, new shortness of breath, etc

## 2024-03-17 NOTE — ED Provider Notes (Addendum)
 " [facility]    CSN: [id number] Arrival date & time: 03/17/24  1043      History   Chief Complaint Chief Complaint  Patient presents with   Nasal Congestion   Facial Pain    sinus   Otalgia    HPI Jacqueline Fischer is a 69 y.o. female presenting w sinus congestion and ear pain.  Pt developed runny nose, congestion, cough on Monday (5 days ago). Yesterday she developed loss of voice jaw pain, ear pain, and thick green nasal congestion. Ears are itchy. Significant PND. Notes pressure behind eyes and forehead, and radiation to teeth.  Temperature running as high as 102 per home thermometer.  Taking Mucinex rapid clear which has helped a little bit. Last  Mucinex was 90 minutes ago.  01/2022 surgical history (teoh):  1.  Septoplasty.  2.  Bilateral partial inferior turbinate resection.  3.  Endoscopic right frontal sinusotomy and total ethmoidectomy with polyp removal... 4.  Endoscopic right maxillary antrostomy with polyp removal. 5.  FUSION stereotactic image guidance.  Facial surgery.  HPI  Past Medical History:  Diagnosis Date   Anemia    Years ago   Arthritis    Colitis    1970's   Complication of anesthesia    pt states during nasal surgery she woke up during procedure   Diabetes mellitus without complication (HCC)    Migraine    Pneumonia    PONV (postoperative nausea and vomiting)    Recurrent upper respiratory infection (URI)    Thyroid  disease     Patient Active Problem List   Diagnosis Date Noted   Chronic maxillary sinusitis 09/04/2023   Facial pain 09/04/2023   Chronic rhinitis 03/21/2023   Postnasal drip 03/21/2023   S/P functional endoscopic sinus surgery 02/02/2022   Other allergic rhinitis 04/08/2017   Polyp of nasal sinus 04/08/2017   Cough 04/08/2017   Status post laparoscopic cholecystectomy 10/19/2014   Gallstones 10/19/2014    Past Surgical History:  Procedure Laterality Date   CATARACT EXTRACTION Right    CHOLECYSTECTOMY  N/A 10/19/2014   Procedure: LAPAROSCOPIC CHOLECYSTECTOMY;  Surgeon: Lynwood Pina, MD;  Location: [facility] OR;  Service: General;  Laterality: N/A;   ETHMOIDECTOMY Right 02/02/2022   Procedure: ENDOSCOPIC RIGHT TOTAL ETHMOIDECTOMY AND FRONTAL RECESS EXPLORATION;  Surgeon: Karis Clunes, MD;  Location: [facility];  Service: ENT;  Laterality: Right;   MAXILLARY ANTROSTOMY Right 02/02/2022   Procedure: ENDOSCOPIC MAXILLARY ANTROSTOMY;  Surgeon: Karis Clunes, MD;  Location: [facility];  Service: ENT;  Laterality: Right;   NASAL POLYP SURGERY     6 polyps removed   NASAL SEPTOPLASTY W/ TURBINOPLASTY Bilateral 02/02/2022   Procedure: NASAL SEPTOPLASTY WITH TURBINATE REDUCTION;  Surgeon: Karis Clunes, MD;  Location: [facility];  Service: ENT;  Laterality: Bilateral;   SINOSCOPY     SINUS ENDO WITH FUSION Bilateral 02/02/2022   Procedure: SINUS ENDOSCOPY WITH STEALTH NAVIGATION;  Surgeon: Karis Clunes, MD;  Location: [facility];  Service: ENT;  Laterality: Bilateral;   TUBAL LIGATION      OB History     Gravida  2   Para  2   Term  2   Preterm      AB      Living  2      SAB      IAB      Ectopic      Multiple      Live Births  2            Home Medications    Prior to Admission medications  Medication Sig Start Date End Date Taking? Authorizing Provider  amoxicillin -clavulanate (AUGMENTIN ) 875-125 MG tablet Take 1 tablet by mouth every 12 (twelve) hours. 03/17/24  Yes Birtie Fellman E, PA-C  almotriptan (AXERT) 12.5 MG tablet Take 12.5 mg by mouth as needed for migraine. may repeat in 2 hours if needed    [provider]  Ascorbic Acid (VITAMIN C PO) Take by mouth.    [provider]  Beclomethasone Dipropionate  (QNASL ) 80 MCG/ACT AERS Use one to two puffs in each nostril once daily. 07/28/18   Kozlow, Eric J, MD  Cholecalciferol (VITAMIN D PO) Take by mouth.    [provider]  montelukast  (SINGULAIR ) 10 MG  tablet Take 1 tablet (10 mg total) by mouth at bedtime. 07/22/20   Cheryl Reusing, FNP    Family History Family History  Problem Relation Age of Onset   Hypertension Mother    Diabetes type II Mother    Renal Disease Mother    Dementia Father    Congestive Heart Failure Father    Stroke Father    Hypertension Father    Renal Disease Brother    Diabetes type II Brother    Hypertension Sister    Diabetes Maternal Grandmother    Cancer Other     Social History Social History[1]   Allergies   Fluticasone, Tetracycline hcl, Hydrocodone, Oxycodone , Tetracycline, and Tetracyclines & related   Review of Systems Review of Systems  Constitutional:  Negative for appetite change, chills and fever.  HENT:  Positive for congestion, ear pain and voice change. Negative for rhinorrhea, sinus pressure, sinus pain and sore throat.   Eyes:  Negative for redness and visual disturbance.  Respiratory:  Positive for cough. Negative for chest tightness, shortness of breath and wheezing.   Cardiovascular:  Negative for chest pain and palpitations.  Gastrointestinal:  Negative for abdominal pain, constipation, diarrhea, nausea and vomiting.  Genitourinary:  Negative for dysuria, frequency and urgency.  Musculoskeletal:  Negative for myalgias.  Neurological:  Negative for dizziness, weakness and headaches.  Psychiatric/Behavioral:  Negative for confusion.   All other systems reviewed and are negative.    Physical Exam Triage Vital Signs ED Triage Vitals  Encounter Vitals Group     BP 03/17/24 1054 (!) 166/81     Girls Systolic BP Percentile --      Girls Diastolic BP Percentile --      Boys Systolic BP Percentile --      Boys Diastolic BP Percentile --      Pulse Rate 03/17/24 1054 85     Resp 03/17/24 1054 17     Temp 03/17/24 1054 99 F (37.2 C)     Temp Source 03/17/24 1054 Oral     SpO2 03/17/24 1054 95 %     Weight --      Height --      Head Circumference --      Peak Flow --       Pain Score 03/17/24 1058 3     Pain Loc --      Pain Education --      Exclude from Growth Chart --    No data found.  Updated Vital Signs BP (!) 144/85 (BP Location: Right Arm)   Pulse 85   Temp 99 F (37.2 C) (Oral)   Resp 17   SpO2 95%  Visual Acuity Right Eye Distance:   Left Eye Distance:   Bilateral Distance:    Right Eye Near:   Left Eye Near:    Bilateral Near:     Physical Exam Vitals reviewed.  Constitutional:      General: She is not in acute distress.    Appearance: Normal appearance. She is not ill-appearing.  HENT:     Head: Normocephalic and atraumatic.     Right Ear: Tympanic membrane, ear canal and external ear normal. No tenderness. No middle ear effusion. There is no impacted cerumen. Tympanic membrane is not perforated, erythematous, retracted or bulging.     Left Ear: Tympanic membrane, ear canal and external ear normal. No tenderness.  No middle ear effusion. There is no impacted cerumen. Tympanic membrane is not perforated, erythematous, retracted or bulging.     Nose: No congestion.     Right Sinus: Maxillary sinus tenderness present. No frontal sinus tenderness.     Left Sinus: Maxillary sinus tenderness present. No frontal sinus tenderness.     Mouth/Throat:     Mouth: Mucous membranes are moist.     Pharynx: Uvula midline. No oropharyngeal exudate or posterior oropharyngeal erythema.     Tonsils: No tonsillar exudate.     Comments: Hoarse voice  Eyes:     Extraocular Movements: Extraocular movements intact.     Pupils: Pupils are equal, round, and reactive to light.  Cardiovascular:     Rate and Rhythm: Normal rate and regular rhythm.     Heart sounds: Normal heart sounds.  Pulmonary:     Effort: Pulmonary effort is normal.     Breath sounds: Normal breath sounds. No decreased breath sounds, wheezing, rhonchi or rales.  Abdominal:     Palpations: Abdomen is soft.     Tenderness: There is no abdominal tenderness. There is no guarding  or rebound.  Lymphadenopathy:     Cervical: No cervical adenopathy.     Right cervical: No superficial, deep or posterior cervical adenopathy.    Left cervical: No superficial, deep or posterior cervical adenopathy.  Skin:    Comments: No rash   Neurological:     General: No focal deficit present.     Mental Status: She is alert and oriented to person, place, and time.  Psychiatric:        Mood and Affect: Mood normal.        Behavior: Behavior normal.        Thought Content: Thought content normal.        Judgment: Judgment normal.      UC Treatments / Results  Labs (all labs ordered are listed, but only abnormal results are displayed) Labs Reviewed - No data to display  EKG   Radiology No results found.  Procedures Procedures (including critical care time)  Medications Ordered in UC Medications - No data to display  Initial Impression / Assessment and Plan / UC Course  I have reviewed the triage vital signs and the nursing notes.  Pertinent labs & imaging results that were available during my care of the patient were reviewed by me and considered in my medical decision making (see chart for details).     Patient is a pleasant 68 y.o. female presenting with recurrent sinusitis. The patient is afebrile and nontachycardic.  She has a history of chronic sinusitis, and has had multiple sinus surgeries.  Did not check a COVID or influenza test due to duration of symptoms.  Chronic sinusitis - followed by  ENT. S/p sinus surgery.  Augmentin  sent.  Continue Beclomethasone Dipropionate  (QNASL ) nasal spray.  Follow-up with ENT if symptoms persist  Final Clinical Impressions(s) / UC Diagnoses   Final diagnoses:  Acute recurrent maxillary sinusitis     Discharge Instructions      -For your sinus infection, start the: -Start the antibiotic-Augmentin  (amoxicillin -clavulanate), 1 pill every 12 hours for 7 days.  You can take this with food like with breakfast and  dinner. -Continue the Beclomethasone Dipropionate  (QNASL ) 80 MCG/ACT AERS nasal spray -Continue the Mucinex if it is helping -Follow-up if symptoms persist, or new symptoms like cough productive of dark or thick sputum, new shortness of breath, etc     ED Prescriptions     Medication Sig Dispense Auth. Provider   amoxicillin -clavulanate (AUGMENTIN ) 875-125 MG tablet Take 1 tablet by mouth every 12 (twelve) hours. 14 tablet Deeann Servidio E, PA-C      PDMP not reviewed this encounter.    Arlyss Leita BRAVO, PA-C 03/17/24 1118     [1]  Social History Tobacco Use   Smoking status: Former    Current packs/day: 0.00    Types: Cigarettes    Quit date: 10/17/1973    Years since quitting: 50.4   Smokeless tobacco: Never  Vaping Use   Vaping status: Never Used  Substance Use Topics   Alcohol use: No    Comment: occ   Drug use: No     Arlyss Leita BRAVO, PA-C 03/17/24 1140  "

## 2024-03-17 NOTE — ED Triage Notes (Signed)
 Pt developed runny nose, congestion, cough on Monday. Yesterday she developed loss of voice jaw pain, ear pain, and green mucous.

## 2024-08-30 ENCOUNTER — Ambulatory Visit (INDEPENDENT_AMBULATORY_CARE_PROVIDER_SITE_OTHER): Admitting: Otolaryngology
